# Patient Record
Sex: Female | Born: 1935 | Race: White | Hispanic: No | Smoking: Former smoker
Health system: Southern US, Community
[De-identification: ages and names within clinical notes are randomized; demographics above are authoritative.]

## PROBLEM LIST (undated history)

## (undated) DIAGNOSIS — G629 Polyneuropathy, unspecified: Secondary | ICD-10-CM

## (undated) DIAGNOSIS — C801 Malignant (primary) neoplasm, unspecified: Secondary | ICD-10-CM

## (undated) DIAGNOSIS — M25562 Pain in left knee: Secondary | ICD-10-CM

## (undated) DIAGNOSIS — I2510 Atherosclerotic heart disease of native coronary artery without angina pectoris: Secondary | ICD-10-CM

## (undated) DIAGNOSIS — K219 Gastro-esophageal reflux disease without esophagitis: Secondary | ICD-10-CM

## (undated) DIAGNOSIS — E785 Hyperlipidemia, unspecified: Secondary | ICD-10-CM

## (undated) HISTORY — PX: COLONOSCOPY W/ POLYPECTOMY: SHX1380

## (undated) HISTORY — DX: Hyperlipidemia, unspecified: E78.5

## (undated) HISTORY — DX: Gastro-esophageal reflux disease without esophagitis: K21.9

## (undated) HISTORY — PX: FOOT ARTHRODESIS: SHX1655

## (undated) HISTORY — DX: Pain in left knee: M25.562

## (undated) HISTORY — PX: ABDOMINAL HYSTERECTOMY: SHX81

## (undated) HISTORY — PX: OTHER SURGICAL HISTORY: SHX169

## (undated) HISTORY — PX: MASTECTOMY: SHX3

---

## 1999-10-08 ENCOUNTER — Emergency Department (HOSPITAL_COMMUNITY): Admission: EM | Admit: 1999-10-08 | Discharge: 1999-10-08 | Payer: Self-pay | Admitting: Emergency Medicine

## 2009-10-30 ENCOUNTER — Emergency Department (HOSPITAL_BASED_OUTPATIENT_CLINIC_OR_DEPARTMENT_OTHER): Admission: EM | Admit: 2009-10-30 | Discharge: 2009-10-30 | Payer: Self-pay | Admitting: Emergency Medicine

## 2009-10-30 ENCOUNTER — Ambulatory Visit: Payer: Self-pay | Admitting: Diagnostic Radiology

## 2012-04-12 ENCOUNTER — Emergency Department (HOSPITAL_BASED_OUTPATIENT_CLINIC_OR_DEPARTMENT_OTHER): Payer: Medicare Other

## 2012-04-12 ENCOUNTER — Emergency Department (HOSPITAL_BASED_OUTPATIENT_CLINIC_OR_DEPARTMENT_OTHER)
Admission: EM | Admit: 2012-04-12 | Discharge: 2012-04-12 | Disposition: A | Discharge status: Home / Self Care | Payer: Medicare Other | Attending: Emergency Medicine | Admitting: Emergency Medicine

## 2012-04-12 ENCOUNTER — Encounter (HOSPITAL_BASED_OUTPATIENT_CLINIC_OR_DEPARTMENT_OTHER): Payer: Self-pay | Admitting: *Deleted

## 2012-04-12 DIAGNOSIS — J069 Acute upper respiratory infection, unspecified: Secondary | ICD-10-CM | POA: Insufficient documentation

## 2012-04-12 DIAGNOSIS — R059 Cough, unspecified: Secondary | ICD-10-CM | POA: Insufficient documentation

## 2012-04-12 DIAGNOSIS — I2510 Atherosclerotic heart disease of native coronary artery without angina pectoris: Secondary | ICD-10-CM | POA: Insufficient documentation

## 2012-04-12 DIAGNOSIS — W010XXA Fall on same level from slipping, tripping and stumbling without subsequent striking against object, initial encounter: Secondary | ICD-10-CM | POA: Insufficient documentation

## 2012-04-12 DIAGNOSIS — Z853 Personal history of malignant neoplasm of breast: Secondary | ICD-10-CM | POA: Insufficient documentation

## 2012-04-12 DIAGNOSIS — M79609 Pain in unspecified limb: Secondary | ICD-10-CM | POA: Insufficient documentation

## 2012-04-12 DIAGNOSIS — R05 Cough: Secondary | ICD-10-CM | POA: Insufficient documentation

## 2012-04-12 DIAGNOSIS — S52609A Unspecified fracture of lower end of unspecified ulna, initial encounter for closed fracture: Secondary | ICD-10-CM | POA: Insufficient documentation

## 2012-04-12 DIAGNOSIS — S0990XA Unspecified injury of head, initial encounter: Secondary | ICD-10-CM | POA: Insufficient documentation

## 2012-04-12 DIAGNOSIS — W19XXXA Unspecified fall, initial encounter: Secondary | ICD-10-CM

## 2012-04-12 HISTORY — DX: Atherosclerotic heart disease of native coronary artery without angina pectoris: I25.10

## 2012-04-12 HISTORY — DX: Polyneuropathy, unspecified: G62.9

## 2012-04-12 HISTORY — DX: Malignant (primary) neoplasm, unspecified: C80.1

## 2012-04-12 LAB — CBC WITH DIFFERENTIAL/PLATELET
Basophils Relative: 0 % (ref 0–1)
Eosinophils Absolute: 0.1 10*3/uL (ref 0.0–0.7)
Eosinophils Relative: 1 % (ref 0–5)
HCT: 29.3 % — ABNORMAL LOW (ref 36.0–46.0)
Lymphocytes Relative: 6 % — ABNORMAL LOW (ref 12–46)
Lymphs Abs: 0.6 10*3/uL — ABNORMAL LOW (ref 0.7–4.0)
MCH: 27.3 pg (ref 26.0–34.0)
MCV: 76.3 fL — ABNORMAL LOW (ref 78.0–100.0)
Monocytes Absolute: 0.6 10*3/uL (ref 0.1–1.0)
Monocytes Relative: 6 % (ref 3–12)
RBC: 3.84 MIL/uL — ABNORMAL LOW (ref 3.87–5.11)
WBC: 10.6 10*3/uL — ABNORMAL HIGH (ref 4.0–10.5)

## 2012-04-12 LAB — BASIC METABOLIC PANEL
BUN: 13 mg/dL (ref 6–23)
Creatinine, Ser: 1 mg/dL (ref 0.50–1.10)
GFR calc Af Amer: 62 mL/min — ABNORMAL LOW (ref >90)
GFR calc non Af Amer: 54 mL/min — ABNORMAL LOW (ref >90)
Potassium: 4.2 mEq/L (ref 3.5–5.1)

## 2012-04-12 MED ORDER — OXYCODONE-ACETAMINOPHEN 5-325 MG PO TABS
0.5000 | ORAL_TABLET | Freq: Once | ORAL | Status: AC
  Administered 2012-04-12: 0.5 via ORAL
  Filled 2012-04-12: qty 1

## 2012-04-12 MED ORDER — OXYCODONE-ACETAMINOPHEN 5-325 MG PO TABS: 0.5000 | ORAL_TABLET | Freq: Four times a day (QID) | ORAL | Status: AC | PRN

## 2012-04-12 MED ORDER — SODIUM CHLORIDE 0.9 % IV BOLUS (SEPSIS): 500.0000 mL | Freq: Once | INTRAVENOUS | Status: DC

## 2012-04-12 NOTE — ED Notes (Signed)
Patient ambulatory with stand by assistance; patient denies dizziness; gait steady.

## 2012-04-12 NOTE — ED Notes (Signed)
Splint applied; patient also placed in sling for comfort and to support the weight of the splint.

## 2012-04-12 NOTE — Discharge Instructions (Signed)
Ulnar Fracture You have a fracture (broken bone) of the forearm. This is the part of your arm between the elbow and your wrist. Your forearm is made up of two bones. These are the radius and ulna. Your fracture is in the ulna. This is the bone in your forearm located on the little finger side of your forearm. A cast or splint is used to protect and keep your injured bone from moving. The cast or splint will be on generally for about 5 to 6 weeks, with individual variations. HOME CARE INSTRUCTIONS   Keep the injured part elevated while sitting or lying down. Keep the injury above the level of your heart (the center of the chest). This will decrease swelling and pain.   Apply ice to the injury for 15 to 20 minutes, 3 to 4 times per day while awake, for 2 days. Put the ice in a plastic bag and place a towel between the bag of ice and your cast or splint.   Move your fingers to avoid stiffness and minimize swelling.   If you have a plaster or fiberglass cast:   Do not try to scratch the skin under the cast using sharp or pointed objects.   Check the skin around the cast every day. You may put lotion on any red or sore areas.   Keep your cast dry and clean.   If you have a plaster splint:   Wear the splint as directed.   You may loosen the elastic around the splint if your fingers become numb, tingle, or turn cold or blue.   Do not put pressure on any part of your cast or splint. It may break. Rest your cast only on a pillow the first 24 hours until it is fully hardened.   Your cast or splint can be protected during bathing with a plastic bag. Do not lower the cast or splint into water.   Only take over-the-counter or prescription medicines for pain, discomfort, or fever as directed by your caregiver.  SEEK IMMEDIATE MEDICAL CARE IF:   Your cast gets damaged or breaks.   You have more severe pain or swelling than you did before the cast.   You have severe pain when stretching your  fingers.   There is a bad smell or new stains and/or purulent (pus like) drainage coming from under the cast.  Document Released: 03/12/2006 Document Revised: 09/18/2011 Document Reviewed: 08/14/2007 [facility] Patient Information 2012 [place], [place].  Upper Respiratory Infection, Adult An upper respiratory infection (URI) is also sometimes known as the common cold. The upper respiratory tract includes the nose, sinuses, throat, trachea, and bronchi. Bronchi are the airways leading to the lungs. Most people improve within 1 week, but symptoms can last up to 2 weeks. A residual cough may last even longer.  CAUSES Many different viruses can infect the tissues lining the upper respiratory tract. The tissues become irritated and inflamed and often become very moist. Mucus production is also common. A cold is contagious. You can easily spread the virus to others by oral contact. This includes kissing, sharing a glass, coughing, or sneezing. Touching your mouth or nose and then touching a surface, which is then touched by another person, can also spread the virus. SYMPTOMS  Symptoms typically develop 1 to 3 days after you come in contact with a cold virus. Symptoms vary from person to person. They may include:  Runny nose.   Sneezing.   Nasal congestion.   Sinus irritation.  Sore throat.   Loss of voice (laryngitis).   Cough.   Fatigue.   Muscle aches.   Loss of appetite.   Headache.   Low-grade fever.  DIAGNOSIS  You might diagnose your own cold based on familiar symptoms, since most people get a cold 2 to 3 times a year. Your caregiver can confirm this based on your exam. Most importantly, your caregiver can check that your symptoms are not due to another disease such as strep throat, sinusitis, pneumonia, asthma, or epiglottitis. Blood tests, throat tests, and X-rays are not necessary to diagnose a common cold, but they may sometimes be helpful in excluding other more serious  diseases. Your caregiver will decide if any further tests are required. RISKS AND COMPLICATIONS  You may be at risk for a more severe case of the common cold if you smoke cigarettes, have chronic heart disease (such as heart failure) or lung disease (such as asthma), or if you have a weakened immune system. The very young and very old are also at risk for more serious infections. Bacterial sinusitis, middle ear infections, and bacterial pneumonia can complicate the common cold. The common cold can worsen asthma and chronic obstructive pulmonary disease (COPD). Sometimes, these complications can require emergency medical care and may be life-threatening. PREVENTION  The best way to protect against getting a cold is to practice good hygiene. Avoid oral or hand contact with people with cold symptoms. Wash your hands often if contact occurs. There is no clear evidence that vitamin C, vitamin E, echinacea, or exercise reduces the chance of developing a cold. However, it is always recommended to get plenty of rest and practice good nutrition. TREATMENT  Treatment is directed at relieving symptoms. There is no cure. Antibiotics are not effective, because the infection is caused by a virus, not by bacteria. Treatment may include:  Increased fluid intake. Sports drinks offer valuable electrolytes, sugars, and fluids.   Breathing heated mist or steam (vaporizer or shower).   Eating chicken soup or other clear broths, and maintaining good nutrition.   Getting plenty of rest.   Using gargles or lozenges for comfort.   Controlling fevers with ibuprofen or acetaminophen as directed by your caregiver.   Increasing usage of your inhaler if you have asthma.  Zinc gel and zinc lozenges, taken in the first 24 hours of the common cold, can shorten the duration and lessen the severity of symptoms. Pain medicines may help with fever, muscle aches, and throat pain. A variety of non-prescription medicines are available  to treat congestion and runny nose. Your caregiver can make recommendations and may suggest nasal or lung inhalers for other symptoms.  HOME CARE INSTRUCTIONS   Only take over-the-counter or prescription medicines for pain, discomfort, or fever as directed by your caregiver.   Use a warm mist humidifier or inhale steam from a shower to increase air moisture. This may keep secretions moist and make it easier to breathe.   Drink enough water and fluids to keep your urine clear or pale yellow.   Rest as needed.   Return to work when your temperature has returned to normal or as your caregiver advises. You may need to stay home longer to avoid infecting others. You can also use a face mask and careful hand washing to prevent spread of the virus.  SEEK MEDICAL CARE IF:   After the first few days, you feel you are getting worse rather than better.   You need your caregiver's advice about  medicines to control symptoms.   You develop chills, worsening shortness of breath, or brown or red sputum. These may be signs of pneumonia.   You develop yellow or brown nasal discharge or pain in the face, especially when you bend forward. These may be signs of sinusitis.   You develop a fever, swollen neck glands, pain with swallowing, or white areas in the back of your throat. These may be signs of strep throat.  SEEK IMMEDIATE MEDICAL CARE IF:   You have a fever.   You develop severe or persistent headache, ear pain, sinus pain, or chest pain.   You develop wheezing, a prolonged cough, cough up blood, or have a change in your usual mucus (if you have chronic lung disease).   You develop sore muscles or a stiff neck.  Document Released: 03/25/2001 Document Revised: 09/18/2011 Document Reviewed: 01/31/2011 [facility] Patient Information 2012 [place], [place].

## 2012-04-12 NOTE — ED Notes (Signed)
MD at bedside. 

## 2012-04-12 NOTE — ED Provider Notes (Signed)
History     CSN: [id number]  Arrival date & time 04/12/12  4098   First MD Initiated Contact with Patient 04/12/12 [phone]      Chief Complaint  Patient presents with  . Fall    (Consider location/radiation/quality/duration/timing/severity/associated sxs/prior treatment) HPI Pt reports she has had URI symptoms for the last 2 days, nasal congestion, sore throat, hoarse voice and productive cough, no fever, CP or SOB. She has felt dizzy headed, worse when she stands. She reports last night around 11pm she got up to clean the kleenexes off the living room floor and she lost her balance, falling over a chair. She reports she struck her head on the wall, but did not lose consciousness. She also injured her R great toe and her L wrist in the fall. She waited until this morning to call her daughter to bring her to the ED. Her pain in wrist and toe is moderate, aching and worse with movement.   Past Medical History  Diagnosis Date  . Cancer   . Coronary artery disease   . Neuropathy     Past Surgical History  Procedure Date  . Mastectomy   . Abdominal hysterectomy   . Colonoscopy w/ polypectomy     History reviewed. No pertinent family history.  History  Substance Use Topics  . Smoking status: Former Games developer  . Smokeless tobacco: Not on file  . Alcohol Use: No    OB History    Grav Para Term Preterm Abortions TAB SAB Ect Mult Living                  Review of Systems All other systems reviewed and are negative except as noted in HPI.   Allergies  Lortab; Sulfa antibiotics; Reclast; and Statins  Home Medications   Current Outpatient Rx  Name Route Sig Dispense Refill  . ESOMEPRAZOLE MAGNESIUM 40 MG PO CPDR Oral Take 40 mg by mouth daily before breakfast.    . SERTRALINE HCL 50 MG PO TABS Oral Take 25 mg by mouth daily.      BP 108/57  Pulse 75  Temp 98.1 F (36.7 C) (Oral)  Resp 21  SpO2 100%  Physical Exam  Nursing note and vitals reviewed. Constitutional: She  is oriented to person, place, and time. She appears well-developed and well-nourished.  HENT:  Head: Normocephalic and atraumatic.  Right Ear: Tympanic membrane and ear canal normal.  Left Ear: Tympanic membrane and ear canal normal.  Nose: Rhinorrhea present.  Mouth/Throat: Uvula is midline, oropharynx is clear and moist and mucous membranes are normal. No oropharyngeal exudate, posterior oropharyngeal edema or posterior oropharyngeal erythema.  Eyes: EOM are normal. Pupils are equal, round, and reactive to light.  Neck: Normal range of motion. Neck supple.  Cardiovascular: Normal rate, normal heart sounds and intact distal pulses.   Pulmonary/Chest: Effort normal and breath sounds normal.  Abdominal: Bowel sounds are normal. She exhibits no distension. There is no tenderness.  Musculoskeletal: She exhibits no edema and no tenderness.       Left wrist: She exhibits decreased range of motion, tenderness and bony tenderness.       Cervical back: She exhibits no bony tenderness.       Feet:  Neurological: She is alert and oriented to person, place, and time. She has normal strength. No cranial nerve deficit or sensory deficit.  Skin: Skin is warm and dry. No rash noted.  Psychiatric: She has a normal mood and affect.  ED Course  Procedures (including critical care time)  Labs Reviewed  CBC WITH DIFFERENTIAL - Abnormal; Notable for the following:    WBC 10.6 (*)     RBC 3.84 (*)     Hemoglobin 10.5 (*)     HCT 29.3 (*)     MCV 76.3 (*)     Neutrophils Relative 88 (*)     Neutro Abs 9.3 (*)     Lymphocytes Relative 6 (*)     Lymphs Abs 0.6 (*)     All other components within normal limits  BASIC METABOLIC PANEL - Abnormal; Notable for the following:    Sodium 133 (*)     Glucose, Bld 146 (*)     GFR calc non Af Amer 54 (*)     GFR calc Af Amer 62 (*)     All other components within normal limits   Dg Chest 2 View  04/12/2012  *RADIOLOGY REPORT*  Clinical Data: Cough,  congestion, fall, history of left breast cancer and left mastectomy  CHEST - 2 VIEW  Comparison: None.  Findings: Cardiomediastinal silhouette is unremarkable.  Surgical clips are noted in the left axilla and left upper chest wall.  The patient is status post left mastectomy.  No acute infiltrate or pulmonary edema.  No diagnostic pneumothorax.  IMPRESSION: No active disease.  Postsurgical changes are noted left axilla and left upper chest wall.  Status post left mastectomy.  Original Report Authenticated By: Natasha Mead, M.D.   Dg Wrist Complete Left  04/12/2012  *RADIOLOGY REPORT*  Clinical Data: Fall  LEFT WRIST - COMPLETE 3+ VIEW  Comparison: 10/30/2009  Findings: Four views of the left wrist submitted.  There is a vague cortical irregularity distal left ulna suspicious for  subtle nondisplaced fracture.  Degenerative changes are noted at the joint anterior aspect of the navicular.  Degenerative changes first carpal metacarpal joint.  Mild degenerative narrowing of radiocarpal joint.  IMPRESSION: There is subtle cortical irregularity distal aspect of the left ulna suspicious for nondisplaced fracture.  Degenerative changes distal aspect of the navicular and first carpal metacarpal joint. Degenerative changes radiocarpal joint.  Original Report Authenticated By: Natasha Mead, M.D.   Ct Head Wo Contrast  04/12/2012  *RADIOLOGY REPORT*  Clinical Data: Fall, head injury  CT HEAD WITHOUT CONTRAST  Technique:  Contiguous axial images were obtained from the base of the skull through the vertex without contrast.  Comparison: None.  Findings: No skull fracture is noted.  Paranasal sinuses shows nodular mucosal thickening right maxillary sinus measures 2.4 cm probable mucous retention cyst.  Mastoid air cells are unremarkable.  No intracranial hemorrhage, mass effect or midline shift.  No acute infarction.  No mass lesion is noted on this unenhanced scan.  IMPRESSION: No acute intracranial abnormality.  There is nodular  mucosal thickening right maxillary sinus probable due to mucous retention cyst.  Original Report Authenticated By: Natasha Mead, M.D.   Dg Toe Great Right  04/12/2012  *RADIOLOGY REPORT*  Clinical Data: Pain post fall  RIGHT GREAT TOE  Comparison: None.  Findings: Three views of the right great toe submitted.  No acute fracture or subluxation.  Mild degenerative changes distal first metatarsal.  IMPRESSION: No acute fracture or subluxation.  Mild generative changes distal first metatarsal.  Original Report Authenticated By: Natasha Mead, M.D.     1. URI (upper respiratory infection)   2. Fall   3. Ulna distal fracture       MDM   Labs and  imaging as above. Given ulnar gutter splint, placed by EMT, good distal pulses. Advised Hand followup for recheck. Advised decongestants for viral URI symptoms.        Deanza Upperman B. Bernette Mayers, MD 04/12/12 [phone]

## 2012-04-12 NOTE — ED Notes (Signed)
Patient requested "mild pain medication" prior to the application of the splint.  RN aware.

## 2012-04-12 NOTE — ED Notes (Signed)
Pt. States that she has had a "respiratory infection" since Sat. States last night she was reaching for something.Marland Kitchenlost her balance and fell. Denies loc. States she hit her head on the wall. C/o left wrist pain (outer wrist area) c/o right big toe hurting as well. No deformity noted to foot and left wrist tender to palpation. resp even and unlabored

## 2013-04-07 ENCOUNTER — Other Ambulatory Visit: Payer: Self-pay | Admitting: *Deleted

## 2013-04-07 ENCOUNTER — Other Ambulatory Visit: Payer: Self-pay | Admitting: Family Medicine

## 2014-03-31 DIAGNOSIS — E785 Hyperlipidemia, unspecified: Secondary | ICD-10-CM | POA: Diagnosis present

## 2014-03-31 DIAGNOSIS — K219 Gastro-esophageal reflux disease without esophagitis: Secondary | ICD-10-CM | POA: Diagnosis present

## 2014-03-31 DIAGNOSIS — E669 Obesity, unspecified: Secondary | ICD-10-CM | POA: Insufficient documentation

## 2014-04-20 DIAGNOSIS — I252 Old myocardial infarction: Secondary | ICD-10-CM | POA: Insufficient documentation

## 2014-04-20 DIAGNOSIS — N183 Chronic kidney disease, stage 3 unspecified: Secondary | ICD-10-CM | POA: Insufficient documentation

## 2014-04-20 DIAGNOSIS — M5416 Radiculopathy, lumbar region: Secondary | ICD-10-CM | POA: Insufficient documentation

## 2014-04-20 DIAGNOSIS — F3342 Major depressive disorder, recurrent, in full remission: Secondary | ICD-10-CM | POA: Insufficient documentation

## 2014-04-20 DIAGNOSIS — E559 Vitamin D deficiency, unspecified: Secondary | ICD-10-CM | POA: Insufficient documentation

## 2014-10-26 DIAGNOSIS — I739 Peripheral vascular disease, unspecified: Secondary | ICD-10-CM | POA: Diagnosis not present

## 2014-10-26 DIAGNOSIS — L603 Nail dystrophy: Secondary | ICD-10-CM | POA: Diagnosis not present

## 2014-12-04 DIAGNOSIS — J209 Acute bronchitis, unspecified: Secondary | ICD-10-CM | POA: Diagnosis not present

## 2014-12-04 DIAGNOSIS — R05 Cough: Secondary | ICD-10-CM | POA: Diagnosis not present

## 2014-12-14 DIAGNOSIS — R42 Dizziness and giddiness: Secondary | ICD-10-CM | POA: Diagnosis not present

## 2014-12-14 DIAGNOSIS — E538 Deficiency of other specified B group vitamins: Secondary | ICD-10-CM | POA: Diagnosis not present

## 2014-12-14 DIAGNOSIS — F331 Major depressive disorder, recurrent, moderate: Secondary | ICD-10-CM | POA: Diagnosis not present

## 2014-12-14 DIAGNOSIS — R05 Cough: Secondary | ICD-10-CM | POA: Diagnosis not present

## 2014-12-14 DIAGNOSIS — I959 Hypotension, unspecified: Secondary | ICD-10-CM | POA: Diagnosis not present

## 2014-12-19 DIAGNOSIS — D649 Anemia, unspecified: Secondary | ICD-10-CM | POA: Diagnosis not present

## 2014-12-22 DIAGNOSIS — J189 Pneumonia, unspecified organism: Secondary | ICD-10-CM | POA: Diagnosis not present

## 2014-12-26 DIAGNOSIS — D6489 Other specified anemias: Secondary | ICD-10-CM | POA: Diagnosis not present

## 2015-01-04 DIAGNOSIS — D509 Iron deficiency anemia, unspecified: Secondary | ICD-10-CM | POA: Diagnosis not present

## 2015-01-04 DIAGNOSIS — J189 Pneumonia, unspecified organism: Secondary | ICD-10-CM | POA: Diagnosis not present

## 2015-01-04 DIAGNOSIS — I739 Peripheral vascular disease, unspecified: Secondary | ICD-10-CM | POA: Diagnosis not present

## 2015-01-04 DIAGNOSIS — Z800 Family history of malignant neoplasm of digestive organs: Secondary | ICD-10-CM | POA: Diagnosis not present

## 2015-01-08 DIAGNOSIS — M1712 Unilateral primary osteoarthritis, left knee: Secondary | ICD-10-CM | POA: Diagnosis not present

## 2015-01-11 DIAGNOSIS — I739 Peripheral vascular disease, unspecified: Secondary | ICD-10-CM | POA: Diagnosis not present

## 2015-01-11 DIAGNOSIS — L603 Nail dystrophy: Secondary | ICD-10-CM | POA: Diagnosis not present

## 2015-01-11 DIAGNOSIS — L84 Corns and callosities: Secondary | ICD-10-CM | POA: Diagnosis not present

## 2015-03-04 DIAGNOSIS — L03114 Cellulitis of left upper limb: Secondary | ICD-10-CM | POA: Diagnosis not present

## 2015-03-04 DIAGNOSIS — S61452A Open bite of left hand, initial encounter: Secondary | ICD-10-CM | POA: Diagnosis not present

## 2015-03-08 DIAGNOSIS — L03114 Cellulitis of left upper limb: Secondary | ICD-10-CM | POA: Diagnosis not present

## 2015-03-08 DIAGNOSIS — J189 Pneumonia, unspecified organism: Secondary | ICD-10-CM | POA: Diagnosis not present

## 2015-03-08 DIAGNOSIS — R2689 Other abnormalities of gait and mobility: Secondary | ICD-10-CM | POA: Diagnosis not present

## 2015-03-08 DIAGNOSIS — D649 Anemia, unspecified: Secondary | ICD-10-CM | POA: Diagnosis not present

## 2015-03-08 DIAGNOSIS — R6889 Other general symptoms and signs: Secondary | ICD-10-CM | POA: Diagnosis not present

## 2015-03-08 DIAGNOSIS — Z09 Encounter for follow-up examination after completed treatment for conditions other than malignant neoplasm: Secondary | ICD-10-CM | POA: Diagnosis not present

## 2015-03-13 DIAGNOSIS — D631 Anemia in chronic kidney disease: Secondary | ICD-10-CM | POA: Diagnosis present

## 2015-03-29 DIAGNOSIS — L603 Nail dystrophy: Secondary | ICD-10-CM | POA: Diagnosis not present

## 2015-03-29 DIAGNOSIS — I739 Peripheral vascular disease, unspecified: Secondary | ICD-10-CM | POA: Diagnosis not present

## 2015-04-05 DIAGNOSIS — N189 Chronic kidney disease, unspecified: Secondary | ICD-10-CM | POA: Diagnosis not present

## 2015-04-05 DIAGNOSIS — R2689 Other abnormalities of gait and mobility: Secondary | ICD-10-CM | POA: Diagnosis not present

## 2015-04-05 DIAGNOSIS — D631 Anemia in chronic kidney disease: Secondary | ICD-10-CM | POA: Diagnosis not present

## 2015-04-05 DIAGNOSIS — N183 Chronic kidney disease, stage 3 (moderate): Secondary | ICD-10-CM | POA: Diagnosis not present

## 2015-04-19 DIAGNOSIS — M6281 Muscle weakness (generalized): Secondary | ICD-10-CM | POA: Diagnosis not present

## 2015-04-19 DIAGNOSIS — R2689 Other abnormalities of gait and mobility: Secondary | ICD-10-CM | POA: Diagnosis not present

## 2015-04-20 DIAGNOSIS — R739 Hyperglycemia, unspecified: Secondary | ICD-10-CM | POA: Diagnosis not present

## 2015-04-26 DIAGNOSIS — M6281 Muscle weakness (generalized): Secondary | ICD-10-CM | POA: Diagnosis not present

## 2015-04-26 DIAGNOSIS — R2689 Other abnormalities of gait and mobility: Secondary | ICD-10-CM | POA: Diagnosis not present

## 2015-05-17 DIAGNOSIS — M6281 Muscle weakness (generalized): Secondary | ICD-10-CM | POA: Diagnosis not present

## 2015-05-17 DIAGNOSIS — R2689 Other abnormalities of gait and mobility: Secondary | ICD-10-CM | POA: Diagnosis not present

## 2015-05-23 DIAGNOSIS — M6281 Muscle weakness (generalized): Secondary | ICD-10-CM | POA: Diagnosis not present

## 2015-05-23 DIAGNOSIS — R2689 Other abnormalities of gait and mobility: Secondary | ICD-10-CM | POA: Diagnosis not present

## 2015-05-31 DIAGNOSIS — M6281 Muscle weakness (generalized): Secondary | ICD-10-CM | POA: Diagnosis not present

## 2015-05-31 DIAGNOSIS — R2689 Other abnormalities of gait and mobility: Secondary | ICD-10-CM | POA: Diagnosis not present

## 2015-06-07 DIAGNOSIS — M6281 Muscle weakness (generalized): Secondary | ICD-10-CM | POA: Diagnosis not present

## 2015-06-07 DIAGNOSIS — R2689 Other abnormalities of gait and mobility: Secondary | ICD-10-CM | POA: Diagnosis not present

## 2015-06-14 DIAGNOSIS — I739 Peripheral vascular disease, unspecified: Secondary | ICD-10-CM | POA: Diagnosis not present

## 2015-06-14 DIAGNOSIS — L603 Nail dystrophy: Secondary | ICD-10-CM | POA: Diagnosis not present

## 2015-06-21 DIAGNOSIS — M6281 Muscle weakness (generalized): Secondary | ICD-10-CM | POA: Diagnosis not present

## 2015-06-21 DIAGNOSIS — R2689 Other abnormalities of gait and mobility: Secondary | ICD-10-CM | POA: Diagnosis not present

## 2015-06-28 DIAGNOSIS — M6281 Muscle weakness (generalized): Secondary | ICD-10-CM | POA: Diagnosis not present

## 2015-06-28 DIAGNOSIS — R2689 Other abnormalities of gait and mobility: Secondary | ICD-10-CM | POA: Diagnosis not present

## 2015-07-05 DIAGNOSIS — M6281 Muscle weakness (generalized): Secondary | ICD-10-CM | POA: Diagnosis not present

## 2015-07-10 DIAGNOSIS — Z23 Encounter for immunization: Secondary | ICD-10-CM | POA: Diagnosis not present

## 2015-07-10 DIAGNOSIS — R1013 Epigastric pain: Secondary | ICD-10-CM | POA: Diagnosis not present

## 2015-07-10 DIAGNOSIS — R142 Eructation: Secondary | ICD-10-CM | POA: Diagnosis not present

## 2015-07-12 DIAGNOSIS — R2689 Other abnormalities of gait and mobility: Secondary | ICD-10-CM | POA: Diagnosis not present

## 2015-07-12 DIAGNOSIS — M6281 Muscle weakness (generalized): Secondary | ICD-10-CM | POA: Diagnosis not present

## 2015-07-13 DIAGNOSIS — K760 Fatty (change of) liver, not elsewhere classified: Secondary | ICD-10-CM | POA: Diagnosis not present

## 2015-07-13 DIAGNOSIS — N281 Cyst of kidney, acquired: Secondary | ICD-10-CM | POA: Diagnosis not present

## 2015-07-13 DIAGNOSIS — K828 Other specified diseases of gallbladder: Secondary | ICD-10-CM | POA: Diagnosis not present

## 2015-07-13 DIAGNOSIS — K8020 Calculus of gallbladder without cholecystitis without obstruction: Secondary | ICD-10-CM | POA: Diagnosis not present

## 2015-07-13 DIAGNOSIS — R142 Eructation: Secondary | ICD-10-CM | POA: Diagnosis not present

## 2015-07-13 DIAGNOSIS — R1013 Epigastric pain: Secondary | ICD-10-CM | POA: Diagnosis not present

## 2015-07-19 DIAGNOSIS — M6281 Muscle weakness (generalized): Secondary | ICD-10-CM | POA: Diagnosis not present

## 2015-07-19 DIAGNOSIS — R2689 Other abnormalities of gait and mobility: Secondary | ICD-10-CM | POA: Diagnosis not present

## 2015-07-26 DIAGNOSIS — M6281 Muscle weakness (generalized): Secondary | ICD-10-CM | POA: Diagnosis not present

## 2015-07-26 DIAGNOSIS — R2689 Other abnormalities of gait and mobility: Secondary | ICD-10-CM | POA: Diagnosis not present

## 2015-07-31 DIAGNOSIS — R2689 Other abnormalities of gait and mobility: Secondary | ICD-10-CM | POA: Diagnosis not present

## 2015-07-31 DIAGNOSIS — M6281 Muscle weakness (generalized): Secondary | ICD-10-CM | POA: Diagnosis not present

## 2015-08-06 DIAGNOSIS — E785 Hyperlipidemia, unspecified: Secondary | ICD-10-CM | POA: Diagnosis not present

## 2015-08-06 DIAGNOSIS — N281 Cyst of kidney, acquired: Secondary | ICD-10-CM | POA: Diagnosis not present

## 2015-08-06 DIAGNOSIS — E559 Vitamin D deficiency, unspecified: Secondary | ICD-10-CM | POA: Diagnosis not present

## 2015-08-06 DIAGNOSIS — E538 Deficiency of other specified B group vitamins: Secondary | ICD-10-CM | POA: Diagnosis not present

## 2015-08-06 DIAGNOSIS — K8020 Calculus of gallbladder without cholecystitis without obstruction: Secondary | ICD-10-CM | POA: Diagnosis not present

## 2015-08-06 DIAGNOSIS — R946 Abnormal results of thyroid function studies: Secondary | ICD-10-CM | POA: Diagnosis not present

## 2015-08-06 DIAGNOSIS — N183 Chronic kidney disease, stage 3 (moderate): Secondary | ICD-10-CM | POA: Diagnosis not present

## 2015-08-06 DIAGNOSIS — F339 Major depressive disorder, recurrent, unspecified: Secondary | ICD-10-CM | POA: Diagnosis not present

## 2015-08-06 DIAGNOSIS — Z1239 Encounter for other screening for malignant neoplasm of breast: Secondary | ICD-10-CM | POA: Diagnosis not present

## 2015-08-09 DIAGNOSIS — R2689 Other abnormalities of gait and mobility: Secondary | ICD-10-CM | POA: Diagnosis not present

## 2015-08-09 DIAGNOSIS — M6281 Muscle weakness (generalized): Secondary | ICD-10-CM | POA: Diagnosis not present

## 2015-08-23 DIAGNOSIS — R2689 Other abnormalities of gait and mobility: Secondary | ICD-10-CM | POA: Diagnosis not present

## 2015-08-23 DIAGNOSIS — M6281 Muscle weakness (generalized): Secondary | ICD-10-CM | POA: Diagnosis not present

## 2015-08-30 DIAGNOSIS — I739 Peripheral vascular disease, unspecified: Secondary | ICD-10-CM | POA: Diagnosis not present

## 2015-08-30 DIAGNOSIS — L603 Nail dystrophy: Secondary | ICD-10-CM | POA: Diagnosis not present

## 2015-09-18 DIAGNOSIS — Z1231 Encounter for screening mammogram for malignant neoplasm of breast: Secondary | ICD-10-CM | POA: Diagnosis not present

## 2015-09-18 DIAGNOSIS — Z1239 Encounter for other screening for malignant neoplasm of breast: Secondary | ICD-10-CM | POA: Diagnosis not present

## 2015-09-27 DIAGNOSIS — R2689 Other abnormalities of gait and mobility: Secondary | ICD-10-CM | POA: Diagnosis not present

## 2015-09-27 DIAGNOSIS — M6281 Muscle weakness (generalized): Secondary | ICD-10-CM | POA: Diagnosis not present

## 2015-10-03 DIAGNOSIS — Z961 Presence of intraocular lens: Secondary | ICD-10-CM | POA: Diagnosis not present

## 2015-10-03 DIAGNOSIS — H35363 Drusen (degenerative) of macula, bilateral: Secondary | ICD-10-CM | POA: Diagnosis not present

## 2015-10-03 DIAGNOSIS — H2511 Age-related nuclear cataract, right eye: Secondary | ICD-10-CM | POA: Diagnosis not present

## 2015-10-03 DIAGNOSIS — H524 Presbyopia: Secondary | ICD-10-CM | POA: Diagnosis not present

## 2015-10-03 DIAGNOSIS — H25011 Cortical age-related cataract, right eye: Secondary | ICD-10-CM | POA: Diagnosis not present

## 2015-10-19 DIAGNOSIS — H01001 Unspecified blepharitis right upper eyelid: Secondary | ICD-10-CM | POA: Diagnosis not present

## 2015-11-08 DIAGNOSIS — L84 Corns and callosities: Secondary | ICD-10-CM | POA: Diagnosis not present

## 2015-11-08 DIAGNOSIS — I739 Peripheral vascular disease, unspecified: Secondary | ICD-10-CM | POA: Diagnosis not present

## 2015-11-08 DIAGNOSIS — L603 Nail dystrophy: Secondary | ICD-10-CM | POA: Diagnosis not present

## 2015-12-18 DIAGNOSIS — L989 Disorder of the skin and subcutaneous tissue, unspecified: Secondary | ICD-10-CM | POA: Diagnosis not present

## 2015-12-18 DIAGNOSIS — E559 Vitamin D deficiency, unspecified: Secondary | ICD-10-CM | POA: Diagnosis not present

## 2015-12-18 DIAGNOSIS — E539 Vitamin B deficiency, unspecified: Secondary | ICD-10-CM | POA: Diagnosis not present

## 2015-12-18 DIAGNOSIS — E785 Hyperlipidemia, unspecified: Secondary | ICD-10-CM | POA: Diagnosis not present

## 2015-12-18 DIAGNOSIS — E538 Deficiency of other specified B group vitamins: Secondary | ICD-10-CM | POA: Diagnosis not present

## 2015-12-18 DIAGNOSIS — N183 Chronic kidney disease, stage 3 (moderate): Secondary | ICD-10-CM | POA: Diagnosis not present

## 2015-12-18 DIAGNOSIS — D631 Anemia in chronic kidney disease: Secondary | ICD-10-CM | POA: Diagnosis not present

## 2015-12-18 DIAGNOSIS — N189 Chronic kidney disease, unspecified: Secondary | ICD-10-CM | POA: Diagnosis not present

## 2015-12-18 DIAGNOSIS — R946 Abnormal results of thyroid function studies: Secondary | ICD-10-CM | POA: Diagnosis not present

## 2016-01-01 DIAGNOSIS — Z85828 Personal history of other malignant neoplasm of skin: Secondary | ICD-10-CM | POA: Diagnosis not present

## 2016-01-01 DIAGNOSIS — C44629 Squamous cell carcinoma of skin of left upper limb, including shoulder: Secondary | ICD-10-CM | POA: Diagnosis not present

## 2016-01-01 DIAGNOSIS — Z08 Encounter for follow-up examination after completed treatment for malignant neoplasm: Secondary | ICD-10-CM | POA: Diagnosis not present

## 2016-01-01 DIAGNOSIS — L821 Other seborrheic keratosis: Secondary | ICD-10-CM | POA: Diagnosis not present

## 2016-01-14 DIAGNOSIS — J209 Acute bronchitis, unspecified: Secondary | ICD-10-CM | POA: Diagnosis not present

## 2016-02-07 DIAGNOSIS — L84 Corns and callosities: Secondary | ICD-10-CM | POA: Diagnosis not present

## 2016-02-07 DIAGNOSIS — L603 Nail dystrophy: Secondary | ICD-10-CM | POA: Diagnosis not present

## 2016-02-07 DIAGNOSIS — I739 Peripheral vascular disease, unspecified: Secondary | ICD-10-CM | POA: Diagnosis not present

## 2016-04-03 DIAGNOSIS — K219 Gastro-esophageal reflux disease without esophagitis: Secondary | ICD-10-CM | POA: Diagnosis not present

## 2016-04-03 DIAGNOSIS — R232 Flushing: Secondary | ICD-10-CM | POA: Diagnosis not present

## 2016-04-07 ENCOUNTER — Emergency Department (HOSPITAL_COMMUNITY)
Admission: EM | Admit: 2016-04-07 | Discharge: 2016-04-08 | Disposition: A | Discharge status: Home / Self Care | Payer: Medicare Other | Attending: Emergency Medicine | Admitting: Emergency Medicine

## 2016-04-07 ENCOUNTER — Encounter (HOSPITAL_COMMUNITY): Payer: Self-pay | Admitting: Emergency Medicine

## 2016-04-07 ENCOUNTER — Emergency Department (HOSPITAL_COMMUNITY): Payer: Medicare Other

## 2016-04-07 DIAGNOSIS — Y9301 Activity, walking, marching and hiking: Secondary | ICD-10-CM | POA: Insufficient documentation

## 2016-04-07 DIAGNOSIS — Y92009 Unspecified place in unspecified non-institutional (private) residence as the place of occurrence of the external cause: Secondary | ICD-10-CM | POA: Insufficient documentation

## 2016-04-07 DIAGNOSIS — Y999 Unspecified external cause status: Secondary | ICD-10-CM | POA: Insufficient documentation

## 2016-04-07 DIAGNOSIS — T148 Other injury of unspecified body region: Secondary | ICD-10-CM | POA: Diagnosis not present

## 2016-04-07 DIAGNOSIS — S8002XA Contusion of left knee, initial encounter: Secondary | ICD-10-CM | POA: Insufficient documentation

## 2016-04-07 DIAGNOSIS — Z87891 Personal history of nicotine dependence: Secondary | ICD-10-CM | POA: Insufficient documentation

## 2016-04-07 DIAGNOSIS — W010XXA Fall on same level from slipping, tripping and stumbling without subsequent striking against object, initial encounter: Secondary | ICD-10-CM | POA: Insufficient documentation

## 2016-04-07 DIAGNOSIS — I2510 Atherosclerotic heart disease of native coronary artery without angina pectoris: Secondary | ICD-10-CM | POA: Diagnosis not present

## 2016-04-07 DIAGNOSIS — Z79899 Other long term (current) drug therapy: Secondary | ICD-10-CM | POA: Insufficient documentation

## 2016-04-07 DIAGNOSIS — Z859 Personal history of malignant neoplasm, unspecified: Secondary | ICD-10-CM | POA: Diagnosis not present

## 2016-04-07 DIAGNOSIS — S8992XA Unspecified injury of left lower leg, initial encounter: Secondary | ICD-10-CM | POA: Diagnosis not present

## 2016-04-07 DIAGNOSIS — W19XXXA Unspecified fall, initial encounter: Secondary | ICD-10-CM

## 2016-04-07 DIAGNOSIS — M25562 Pain in left knee: Secondary | ICD-10-CM | POA: Diagnosis not present

## 2016-04-07 MED ORDER — ACETAMINOPHEN 500 MG PO TABS
1000.0000 mg | ORAL_TABLET | Freq: Once | ORAL | Status: AC
  Administered 2016-04-07: 1000 mg via ORAL
  Filled 2016-04-07: qty 2

## 2016-04-07 NOTE — ED Notes (Signed)
Bed: WA07 Expected date:  Expected time:  Means of arrival:  Comments: Fall and Knee Pain EMS (Old Person)

## 2016-04-07 NOTE — ED Provider Notes (Signed)
CSN: [id number]     Arrival date & time 04/07/16  2150 History  By signing my name below, I, Jasmyn B. Alexander, attest that this documentation has been prepared under the direction and in the presence of Antonietta Breach, PA-C  Electronically Signed: Tedra Coupe. Sheppard Coil, ED Scribe. 04/07/2016. 10:39 PM.   Chief Complaint  Patient presents with  . Fall    The history is provided by the patient. No language interpreter was used.   HPI Comments: Shauniece Jacobs is a 82 y.o. female brought in by ambulance, with PMHx of CAD and Neuropathy who presents to the Emergency Department complaining of gradual onset, constant, worsening, left knee pain s/p fall that occurred 9 hrs PTA. Pt reports she was walking back into her house, tripped over her shoes and fell onto her knees around 1300. No head injury or LOC. She applied ice to her left knee with mild relief. She reports taking a nap shortly after her fall and when she woke up around 1800, her pain was significantly increased. Denies any SOB or lightheadedness prior to falling. Pt has not had any issues with left knee before. She notes pain is exacerbated upon applying pressure or standing up. Pt does not regularly walk with cane or walker at home.   Past Medical History  Diagnosis Date  . Cancer ([place])   . Coronary artery disease   . Neuropathy [facility])    Past Surgical History  Procedure Laterality Date  . Mastectomy    . Abdominal hysterectomy    . Colonoscopy w/ polypectomy     No family history on file. Social History  Substance Use Topics  . Smoking status: Former Research scientist (life sciences)  . Smokeless tobacco: None  . Alcohol Use: No   OB History    No data available      Review of Systems  Constitutional: Negative for fever.  Musculoskeletal: Positive for myalgias, joint swelling and arthralgias.  A complete 10 system review of systems was obtained and all systems are negative except as noted in the HPI and PMH.    Allergies  Lortab; Sulfa  antibiotics; Reclast; and Statins  Home Medications   Prior to Admission medications   Medication Sig Start Date End Date Taking? Authorizing Provider  diclofenac sodium (VOLTAREN) 1 % GEL Apply 4 g topically 4 (four) times daily. Rub in to your right knee joint as prescribed 04/08/16   Antonietta Breach, PA-C  esomeprazole (NEXIUM) 40 MG capsule Take 40 mg by mouth daily before breakfast.    Historical Provider, MD  oxyCODONE-acetaminophen (PERCOCET/ROXICET) 5-325 MG tablet Take 1-2 tablets by mouth every 6 (six) hours as needed for severe pain. 04/08/16   Antonietta Breach, PA-C  predniSONE (DELTASONE) 20 MG tablet Take 2 tablets (40 mg total) by mouth daily. Take 40 mg by mouth daily for 3 days, then 20mg  by mouth daily for 3 days, then 10mg  daily for 3 days 04/08/16   Antonietta Breach, PA-C  sertraline (ZOLOFT) 50 MG tablet Take 25 mg by mouth daily.    Historical Provider, MD   BP 128/63 mmHg  Pulse 60  Temp(Src) 98.7 F (37.1 C) (Oral)  Resp 18  SpO2 99%   Physical Exam  Constitutional: She is oriented to person, place, and time. She appears well-developed and well-nourished. No distress.  Nontoxic appearing  HENT:  Head: Normocephalic and atraumatic.  Eyes: Conjunctivae and EOM are normal. No scleral icterus.  Neck: Normal range of motion.  Cardiovascular: Normal rate, regular rhythm and intact distal  pulses.   DP and PT pulses 2+ in the LLE  Pulmonary/Chest: Effort normal. No respiratory distress.  Respirations even and unlabored  Musculoskeletal:       Left knee: She exhibits decreased range of motion (decreased AROM secondary to pain), swelling and effusion (mild). She exhibits no deformity, no erythema and normal alignment. Tenderness found. Medial joint line tenderness noted.  Neurological: She is alert and oriented to person, place, and time. She exhibits normal muscle tone. Coordination normal.  Sensation to light touch intact in BLE.   Skin: Skin is warm and dry. No rash noted. She is  not diaphoretic. No erythema. No pallor.  Psychiatric: She has a normal mood and affect. Her behavior is normal.  Nursing note and vitals reviewed.   ED Course  Procedures (including critical care time) DIAGNOSTIC STUDIES: Oxygen Saturation is 100% on RA, normal by my interpretation.    COORDINATION OF CARE: 10:35 PM-Discussed treatment plan which includes X-ray of left knee with pt at bedside and pt agreed to plan.   Imaging Review Dg Knee Complete 4 Views Left  04/07/2016  CLINICAL DATA:  82 year old female with fall and left knee pain EXAM: LEFT KNEE - COMPLETE 4+ VIEW COMPARISON:  None FINDINGS: There is no acute fracture or dislocation. The bones are osteopenia. There is a small suprapatellar effusion. The soft tissues are grossly unremarkable with noted a foreign object. IMPRESSION: No fracture or dislocation. Electronically Signed   By: Anner Crete M.D.   On: 04/07/2016 23:28   I have personally reviewed and evaluated these images and lab results as part of my medical decision-making.   MDM   Final diagnoses:  Knee contusion, left, initial encounter  Fall, initial encounter    82 year old female presents to the emergency department for evaluation of left knee pain after a fall prior to arrival. She denies head trauma and loss of consciousness. Patient neurovascularly intact on exam. No concern for septic joint. She reports weightbearing for many hours after her initial injury. She had worsening pain with weightbearing after waking from a nap tonight. X-ray negative for fracture, dislocation, or bony deformity. There is a small suprapatellar effusion noted. Effusion and recent contusion likely the cause of patient's pain tonight. Patient given prescription for a rolling walker as well as instructions for supportive care. She has been followed by [facility] orthopedics in the past. She is stable for outpatient follow-up with her orthopedist. No indication for further emergent  workup at this time. Patient discharged in satisfactory condition. Patient and family with no unaddressed concerns.  I personally performed the services described in this documentation, which was scribed in my presence. The recorded information has been reviewed and is accurate.    Filed Vitals:   04/07/16 2157 04/07/16 2158 04/08/16 0014  BP: 145/65  128/63  Pulse: 65  60  Temp: 98.7 F (37.1 C)    TempSrc: Oral    Resp: 18  18  SpO2: 100% 96% 99%      Antonietta Breach, PA-C 04/08/16 0053  Virgel Manifold, MD 04/08/16 1037

## 2016-04-07 NOTE — ED Notes (Signed)
Patient transported to X-ray 

## 2016-04-07 NOTE — ED Notes (Signed)
Per EMS, around 1300 pt tripped over steps in her home and landed on her left knee. She was able to get on the couch after the fall. She fell asleep on the couch and woke up with left knee pain and unable to stand and put pressure on

## 2016-04-08 DIAGNOSIS — S8002XA Contusion of left knee, initial encounter: Secondary | ICD-10-CM | POA: Diagnosis not present

## 2016-04-08 DIAGNOSIS — M25562 Pain in left knee: Secondary | ICD-10-CM | POA: Diagnosis not present

## 2016-04-08 MED ORDER — ONDANSETRON 8 MG PO TBDP
8.0000 mg | ORAL_TABLET | Freq: Once | ORAL | Status: AC
  Administered 2016-04-08: 8 mg via ORAL
  Filled 2016-04-08: qty 1

## 2016-04-08 MED ORDER — OXYCODONE-ACETAMINOPHEN 5-325 MG PO TABS
1.0000 | ORAL_TABLET | Freq: Four times a day (QID) | ORAL | Status: DC | PRN
Start: 2016-04-08 — End: 2017-10-19

## 2016-04-08 MED ORDER — HYDROMORPHONE HCL 1 MG/ML IJ SOLN
0.5000 mg | Freq: Once | INTRAMUSCULAR | Status: AC
  Administered 2016-04-08: 0.5 mg via INTRAMUSCULAR
  Filled 2016-04-08: qty 1

## 2016-04-08 MED ORDER — KETOROLAC TROMETHAMINE 30 MG/ML IJ SOLN
15.0000 mg | Freq: Once | INTRAMUSCULAR | Status: AC
  Administered 2016-04-08: 15 mg via INTRAMUSCULAR
  Filled 2016-04-08: qty 1

## 2016-04-08 MED ORDER — DICLOFENAC SODIUM 1 % TD GEL: 4.0000 g | Freq: Four times a day (QID) | TRANSDERMAL | Status: DC

## 2016-04-08 MED ORDER — PREDNISONE 20 MG PO TABS: 40.0000 mg | ORAL_TABLET | Freq: Every day | ORAL | Status: DC

## 2016-04-08 NOTE — Discharge Instructions (Signed)
Keep your knee wrapped. Use a rolling walker when walking/transitioning. Take prednisone as prescribed and apply Voltaren gel topically as prescribed. You may take Percocet as needed for severe pain. Follow up with your orthopedist.  RICE for Routine Care of Injuries Theroutine careofmanyinjuriesincludes rest, ice, compression, and elevation (RICE therapy). RICE therapy is often recommended for injuries to soft tissues, such as a muscle strain, ligament injuries, bruises, and overuse injuries. It can also be used for some bony injuries. Using RICE therapy can help to relieve pain, lessen swelling, and enable your body to heal. Rest Rest is required to allow your body to heal. This usually involves reducing your normal activities and avoiding use of the injured part of your body. Generally, you can return to your normal activities when you are comfortable and have been given permission by your health care provider. Ice Icing your injury helps to keep the swelling down, and it lessens pain. Do not apply ice directly to your skin.  Put ice in a plastic bag.  Place a towel between your skin and the bag.  Leave the ice on for 20 minutes, 2-3 times a day. Do this for as long as you are directed by your health care provider. Compression Compression means putting pressure on the injured area. Compression helps to keep swelling down, gives support, and helps with discomfort. Compression may be done with an elastic bandage. If an elastic bandage has been applied, follow these general tips:  Remove and reapply the bandage every 3-4 hours or as directed by your health care provider.  Make sure the bandage is not wrapped too tightly, because this can cut off circulation. If part of your body beyond the bandage becomes blue, numb, cold, swollen, or more painful, your bandage is most likely too tight. If this occurs, remove your bandage and reapply it more loosely.  See your health care provider if the  bandage seems to be making your problems worse rather than better. Elevation Elevation means keeping the injured area raised. This helps to lessen swelling and decrease pain. If possible, your injured area should be elevated at or above the level of your heart or the center of your chest. [place]? You should seek medical care if:  Your pain and swelling continue.  Your symptoms are getting worse rather than improving. These symptoms may indicate that further evaluation or further X-rays are needed. Sometimes, X-rays may not show a small broken bone (fracture) until a number of days later. Make a follow-up appointment with your health care provider. WHEN SHOULD I SEEK IMMEDIATE MEDICAL CARE? You should seek immediate medical care if:  You have sudden severe pain at or below the area of your injury.  You have redness or increased swelling around your injury.  You have tingling or numbness at or below the area of your injury that does not improve after you remove the elastic bandage.   This information is not intended to replace advice given to you by your health care provider. Make sure you discuss any questions you have with your health care provider.   Document Released: 01/11/2001 Document Revised: 06/20/2015 Document Reviewed: 09/06/2014 Elsevier Interactive Patient Education [employer].

## 2016-04-14 DIAGNOSIS — M25562 Pain in left knee: Secondary | ICD-10-CM | POA: Diagnosis not present

## 2016-05-01 DIAGNOSIS — I739 Peripheral vascular disease, unspecified: Secondary | ICD-10-CM | POA: Diagnosis not present

## 2016-05-01 DIAGNOSIS — L603 Nail dystrophy: Secondary | ICD-10-CM | POA: Diagnosis not present

## 2016-07-03 DIAGNOSIS — L821 Other seborrheic keratosis: Secondary | ICD-10-CM | POA: Diagnosis not present

## 2016-07-03 DIAGNOSIS — Z85828 Personal history of other malignant neoplasm of skin: Secondary | ICD-10-CM | POA: Diagnosis not present

## 2016-07-03 DIAGNOSIS — D225 Melanocytic nevi of trunk: Secondary | ICD-10-CM | POA: Diagnosis not present

## 2016-07-03 DIAGNOSIS — Z08 Encounter for follow-up examination after completed treatment for malignant neoplasm: Secondary | ICD-10-CM | POA: Diagnosis not present

## 2016-07-08 DIAGNOSIS — R7309 Other abnormal glucose: Secondary | ICD-10-CM | POA: Diagnosis not present

## 2016-07-08 DIAGNOSIS — E538 Deficiency of other specified B group vitamins: Secondary | ICD-10-CM | POA: Diagnosis not present

## 2016-07-08 DIAGNOSIS — K219 Gastro-esophageal reflux disease without esophagitis: Secondary | ICD-10-CM | POA: Diagnosis not present

## 2016-07-08 DIAGNOSIS — Z23 Encounter for immunization: Secondary | ICD-10-CM | POA: Diagnosis not present

## 2016-07-08 DIAGNOSIS — E785 Hyperlipidemia, unspecified: Secondary | ICD-10-CM | POA: Diagnosis not present

## 2016-07-08 DIAGNOSIS — N189 Chronic kidney disease, unspecified: Secondary | ICD-10-CM | POA: Diagnosis not present

## 2016-07-08 DIAGNOSIS — N183 Chronic kidney disease, stage 3 (moderate): Secondary | ICD-10-CM | POA: Diagnosis not present

## 2016-07-08 DIAGNOSIS — F339 Major depressive disorder, recurrent, unspecified: Secondary | ICD-10-CM | POA: Diagnosis not present

## 2016-07-08 DIAGNOSIS — D631 Anemia in chronic kidney disease: Secondary | ICD-10-CM | POA: Diagnosis not present

## 2016-07-08 DIAGNOSIS — E559 Vitamin D deficiency, unspecified: Secondary | ICD-10-CM | POA: Diagnosis not present

## 2016-07-24 DIAGNOSIS — I739 Peripheral vascular disease, unspecified: Secondary | ICD-10-CM | POA: Diagnosis not present

## 2016-08-11 DIAGNOSIS — M25561 Pain in right knee: Secondary | ICD-10-CM | POA: Diagnosis not present

## 2016-08-25 DIAGNOSIS — M25561 Pain in right knee: Secondary | ICD-10-CM | POA: Diagnosis not present

## 2016-08-25 DIAGNOSIS — S82811D Torus fracture of upper end of right fibula, subsequent encounter for fracture with routine healing: Secondary | ICD-10-CM | POA: Diagnosis not present

## 2016-09-01 DIAGNOSIS — R399 Unspecified symptoms and signs involving the genitourinary system: Secondary | ICD-10-CM | POA: Diagnosis not present

## 2016-09-12 DIAGNOSIS — S82811D Torus fracture of upper end of right fibula, subsequent encounter for fracture with routine healing: Secondary | ICD-10-CM | POA: Diagnosis not present

## 2016-09-12 DIAGNOSIS — M25561 Pain in right knee: Secondary | ICD-10-CM | POA: Diagnosis not present

## 2016-10-14 DIAGNOSIS — M1711 Unilateral primary osteoarthritis, right knee: Secondary | ICD-10-CM | POA: Diagnosis not present

## 2016-10-15 DIAGNOSIS — H25011 Cortical age-related cataract, right eye: Secondary | ICD-10-CM | POA: Diagnosis not present

## 2016-10-15 DIAGNOSIS — H35363 Drusen (degenerative) of macula, bilateral: Secondary | ICD-10-CM | POA: Diagnosis not present

## 2016-10-15 DIAGNOSIS — Z961 Presence of intraocular lens: Secondary | ICD-10-CM | POA: Diagnosis not present

## 2016-10-15 DIAGNOSIS — H524 Presbyopia: Secondary | ICD-10-CM | POA: Diagnosis not present

## 2016-10-15 DIAGNOSIS — H2511 Age-related nuclear cataract, right eye: Secondary | ICD-10-CM | POA: Diagnosis not present

## 2016-10-21 DIAGNOSIS — M1711 Unilateral primary osteoarthritis, right knee: Secondary | ICD-10-CM | POA: Diagnosis not present

## 2016-10-27 DIAGNOSIS — L603 Nail dystrophy: Secondary | ICD-10-CM | POA: Diagnosis not present

## 2016-10-27 DIAGNOSIS — I739 Peripheral vascular disease, unspecified: Secondary | ICD-10-CM | POA: Diagnosis not present

## 2016-10-28 DIAGNOSIS — M1711 Unilateral primary osteoarthritis, right knee: Secondary | ICD-10-CM | POA: Diagnosis not present

## 2016-11-17 DIAGNOSIS — J209 Acute bronchitis, unspecified: Secondary | ICD-10-CM | POA: Diagnosis not present

## 2016-12-09 DIAGNOSIS — M1711 Unilateral primary osteoarthritis, right knee: Secondary | ICD-10-CM | POA: Diagnosis not present

## 2016-12-16 DIAGNOSIS — H2511 Age-related nuclear cataract, right eye: Secondary | ICD-10-CM | POA: Diagnosis not present

## 2016-12-16 DIAGNOSIS — H269 Unspecified cataract: Secondary | ICD-10-CM | POA: Diagnosis not present

## 2016-12-16 DIAGNOSIS — H25011 Cortical age-related cataract, right eye: Secondary | ICD-10-CM | POA: Diagnosis not present

## 2016-12-26 DIAGNOSIS — M25561 Pain in right knee: Secondary | ICD-10-CM | POA: Diagnosis not present

## 2016-12-26 DIAGNOSIS — M1711 Unilateral primary osteoarthritis, right knee: Secondary | ICD-10-CM | POA: Diagnosis not present

## 2017-01-08 DIAGNOSIS — R7309 Other abnormal glucose: Secondary | ICD-10-CM | POA: Diagnosis not present

## 2017-01-08 DIAGNOSIS — F330 Major depressive disorder, recurrent, mild: Secondary | ICD-10-CM | POA: Diagnosis not present

## 2017-01-08 DIAGNOSIS — N183 Chronic kidney disease, stage 3 (moderate): Secondary | ICD-10-CM | POA: Diagnosis not present

## 2017-01-08 DIAGNOSIS — E785 Hyperlipidemia, unspecified: Secondary | ICD-10-CM | POA: Diagnosis not present

## 2017-01-08 DIAGNOSIS — D631 Anemia in chronic kidney disease: Secondary | ICD-10-CM | POA: Diagnosis not present

## 2017-01-08 DIAGNOSIS — E538 Deficiency of other specified B group vitamins: Secondary | ICD-10-CM | POA: Diagnosis not present

## 2017-01-08 DIAGNOSIS — E559 Vitamin D deficiency, unspecified: Secondary | ICD-10-CM | POA: Diagnosis not present

## 2017-01-08 DIAGNOSIS — K219 Gastro-esophageal reflux disease without esophagitis: Secondary | ICD-10-CM | POA: Diagnosis not present

## 2017-01-12 DIAGNOSIS — M25561 Pain in right knee: Secondary | ICD-10-CM | POA: Diagnosis not present

## 2017-01-12 DIAGNOSIS — M1711 Unilateral primary osteoarthritis, right knee: Secondary | ICD-10-CM | POA: Diagnosis not present

## 2017-01-19 DIAGNOSIS — I739 Peripheral vascular disease, unspecified: Secondary | ICD-10-CM | POA: Diagnosis not present

## 2017-01-19 DIAGNOSIS — L603 Nail dystrophy: Secondary | ICD-10-CM | POA: Diagnosis not present

## 2017-01-29 DIAGNOSIS — R922 Inconclusive mammogram: Secondary | ICD-10-CM | POA: Diagnosis not present

## 2017-01-29 DIAGNOSIS — Z1231 Encounter for screening mammogram for malignant neoplasm of breast: Secondary | ICD-10-CM | POA: Diagnosis not present

## 2017-02-05 DIAGNOSIS — L570 Actinic keratosis: Secondary | ICD-10-CM | POA: Diagnosis not present

## 2017-02-05 DIAGNOSIS — L300 Nummular dermatitis: Secondary | ICD-10-CM | POA: Diagnosis not present

## 2017-02-09 DIAGNOSIS — R928 Other abnormal and inconclusive findings on diagnostic imaging of breast: Secondary | ICD-10-CM | POA: Diagnosis not present

## 2017-03-16 DIAGNOSIS — M1711 Unilateral primary osteoarthritis, right knee: Secondary | ICD-10-CM | POA: Diagnosis not present

## 2017-03-29 DIAGNOSIS — K120 Recurrent oral aphthae: Secondary | ICD-10-CM | POA: Diagnosis not present

## 2017-04-06 DIAGNOSIS — M722 Plantar fascial fibromatosis: Secondary | ICD-10-CM | POA: Diagnosis not present

## 2017-04-14 DIAGNOSIS — M76821 Posterior tibial tendinitis, right leg: Secondary | ICD-10-CM | POA: Diagnosis not present

## 2017-04-14 DIAGNOSIS — M76822 Posterior tibial tendinitis, left leg: Secondary | ICD-10-CM | POA: Diagnosis not present

## 2017-04-14 DIAGNOSIS — M1711 Unilateral primary osteoarthritis, right knee: Secondary | ICD-10-CM | POA: Diagnosis not present

## 2017-05-25 IMAGING — CR DG KNEE COMPLETE 4+V*L*
4 series · 4 of 4 positions shown · non-contrast
Comparison: None

CLINICAL DATA: 81-year-old female with fall and left knee pain

EXAM:
LEFT KNEE - COMPLETE 4+ VIEW

[x knee ap left (1 of 3)]
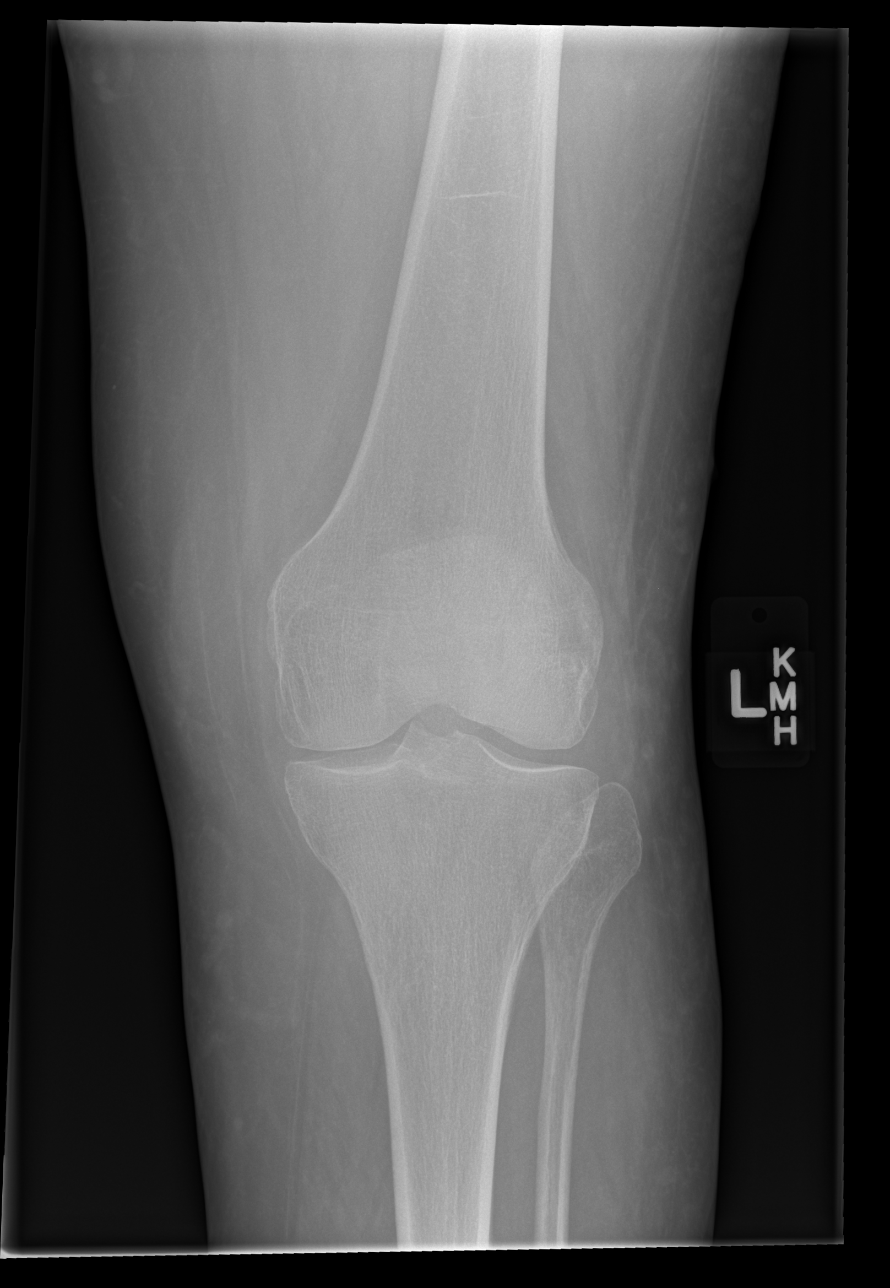

[x knee ap left (2 of 3)]
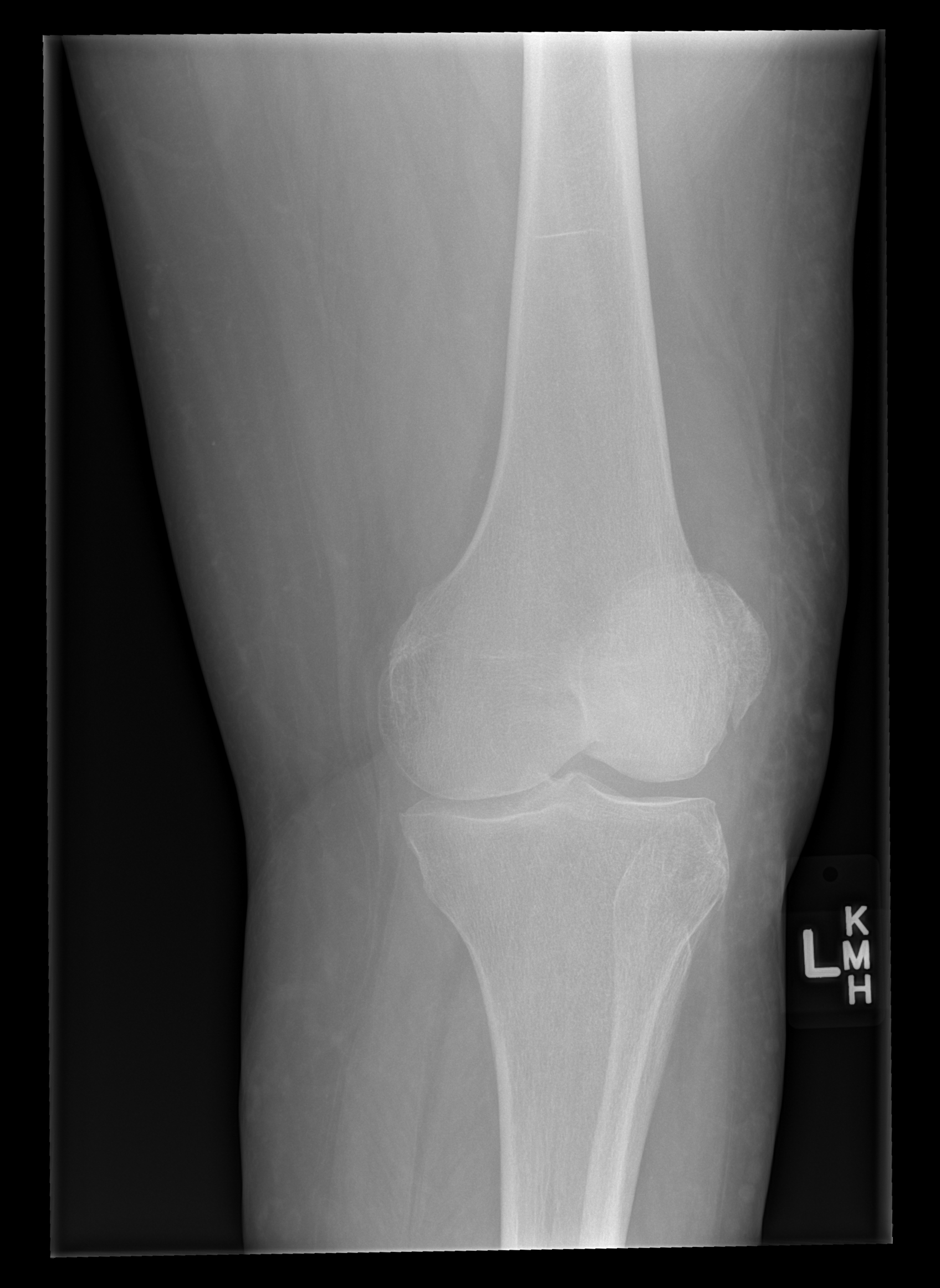

[x knee ap left (3 of 3)]
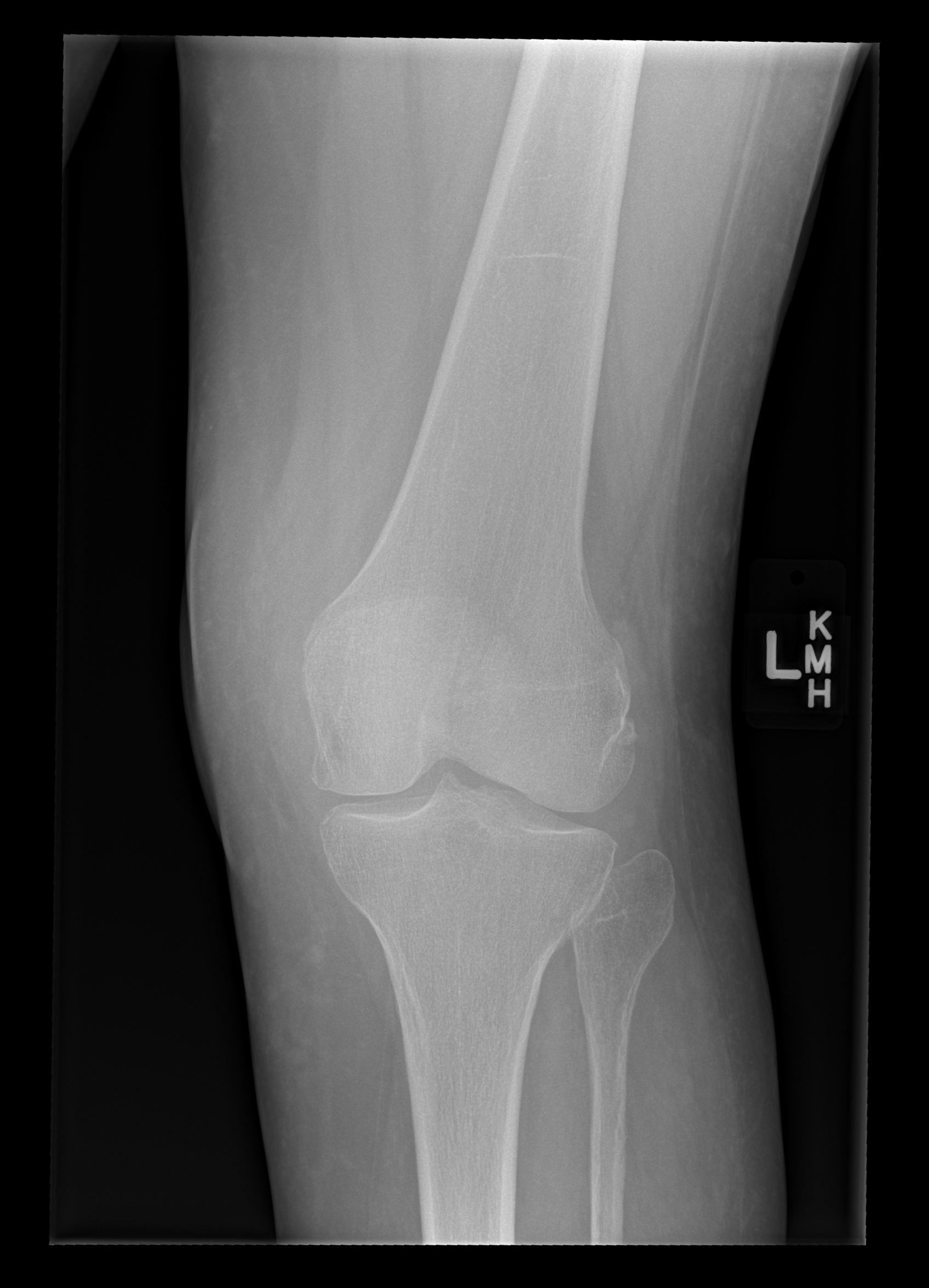

[x knee lat left]
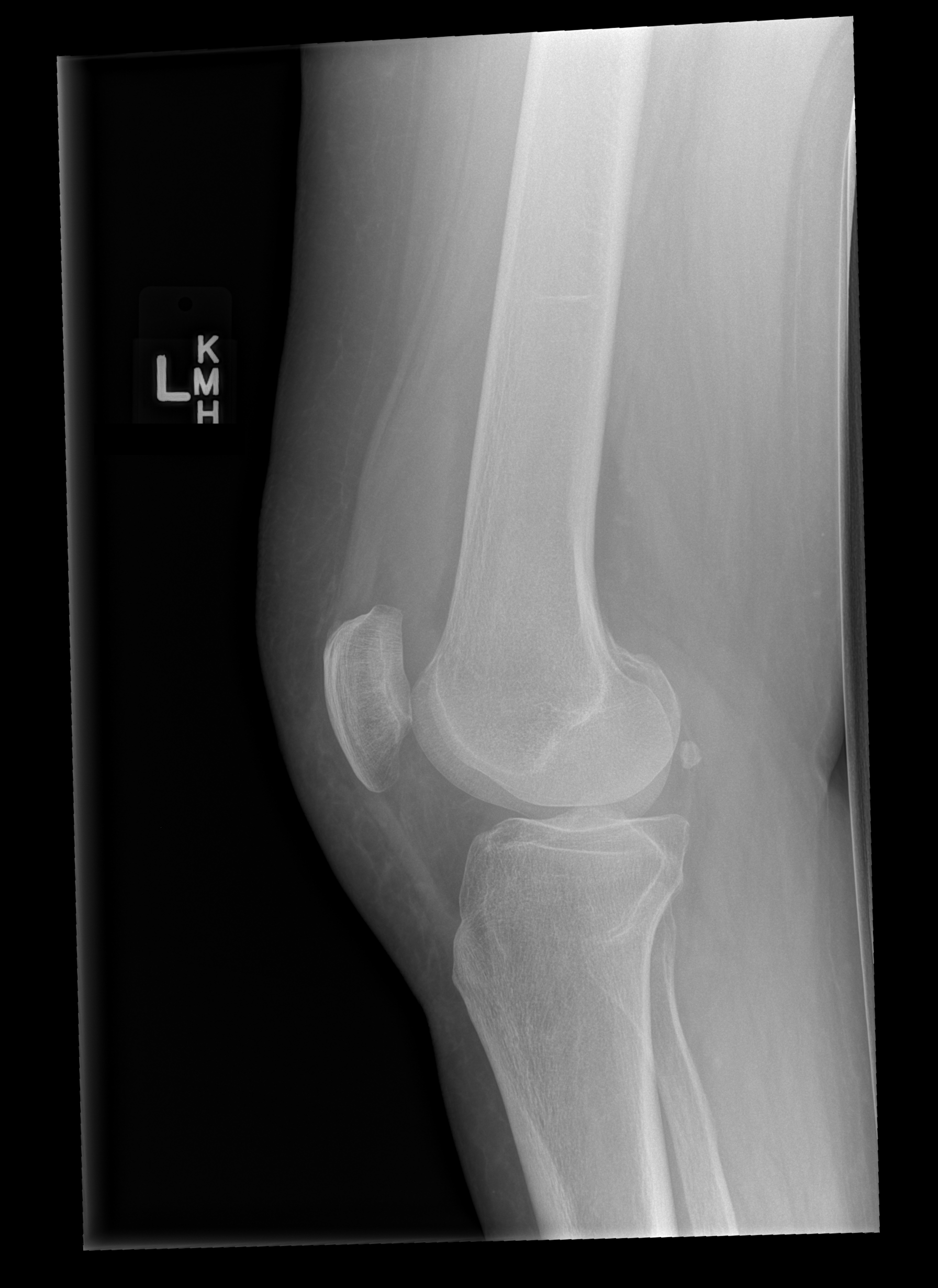

[4 of 4 positions shown; findings below may reference images not displayed]

FINDINGS: There is no acute fracture or dislocation. The bones are osteopenia.
There is a small suprapatellar effusion. The soft tissues are
grossly unremarkable with noted a foreign object.
IMPRESSION: No fracture or dislocation.

## 2017-05-26 DIAGNOSIS — M76821 Posterior tibial tendinitis, right leg: Secondary | ICD-10-CM | POA: Diagnosis not present

## 2017-05-26 DIAGNOSIS — M7061 Trochanteric bursitis, right hip: Secondary | ICD-10-CM | POA: Diagnosis not present

## 2017-05-26 DIAGNOSIS — M76822 Posterior tibial tendinitis, left leg: Secondary | ICD-10-CM | POA: Diagnosis not present

## 2017-05-26 DIAGNOSIS — M1711 Unilateral primary osteoarthritis, right knee: Secondary | ICD-10-CM | POA: Diagnosis not present

## 2017-06-21 DIAGNOSIS — W19XXXA Unspecified fall, initial encounter: Secondary | ICD-10-CM | POA: Diagnosis not present

## 2017-06-21 DIAGNOSIS — S2241XA Multiple fractures of ribs, right side, initial encounter for closed fracture: Secondary | ICD-10-CM | POA: Diagnosis not present

## 2017-06-21 DIAGNOSIS — R0781 Pleurodynia: Secondary | ICD-10-CM | POA: Diagnosis not present

## 2017-06-21 DIAGNOSIS — R918 Other nonspecific abnormal finding of lung field: Secondary | ICD-10-CM | POA: Diagnosis not present

## 2017-07-09 DIAGNOSIS — L84 Corns and callosities: Secondary | ICD-10-CM | POA: Diagnosis not present

## 2017-07-09 DIAGNOSIS — I739 Peripheral vascular disease, unspecified: Secondary | ICD-10-CM | POA: Diagnosis not present

## 2017-07-09 DIAGNOSIS — L603 Nail dystrophy: Secondary | ICD-10-CM | POA: Diagnosis not present

## 2017-07-10 DIAGNOSIS — R7309 Other abnormal glucose: Secondary | ICD-10-CM | POA: Diagnosis not present

## 2017-07-10 DIAGNOSIS — N183 Chronic kidney disease, stage 3 (moderate): Secondary | ICD-10-CM | POA: Diagnosis not present

## 2017-07-10 DIAGNOSIS — E538 Deficiency of other specified B group vitamins: Secondary | ICD-10-CM | POA: Diagnosis not present

## 2017-07-10 DIAGNOSIS — E785 Hyperlipidemia, unspecified: Secondary | ICD-10-CM | POA: Diagnosis not present

## 2017-07-10 DIAGNOSIS — F3342 Major depressive disorder, recurrent, in full remission: Secondary | ICD-10-CM | POA: Diagnosis not present

## 2017-07-10 DIAGNOSIS — K219 Gastro-esophageal reflux disease without esophagitis: Secondary | ICD-10-CM | POA: Diagnosis not present

## 2017-07-10 DIAGNOSIS — E559 Vitamin D deficiency, unspecified: Secondary | ICD-10-CM | POA: Diagnosis not present

## 2017-07-10 DIAGNOSIS — D631 Anemia in chronic kidney disease: Secondary | ICD-10-CM | POA: Diagnosis not present

## 2017-07-10 DIAGNOSIS — Z23 Encounter for immunization: Secondary | ICD-10-CM | POA: Diagnosis not present

## 2017-08-06 DIAGNOSIS — G629 Polyneuropathy, unspecified: Secondary | ICD-10-CM | POA: Diagnosis not present

## 2017-10-19 ENCOUNTER — Ambulatory Visit (INDEPENDENT_AMBULATORY_CARE_PROVIDER_SITE_OTHER): Payer: Medicare Other | Admitting: Neurology

## 2017-10-19 ENCOUNTER — Encounter (INDEPENDENT_AMBULATORY_CARE_PROVIDER_SITE_OTHER): Payer: Self-pay

## 2017-10-19 ENCOUNTER — Encounter: Payer: Self-pay | Admitting: Neurology

## 2017-10-19 VITALS — BP 128/71 | HR 70 | Ht 67.0 in | Wt 206.0 lb

## 2017-10-19 DIAGNOSIS — G5603 Carpal tunnel syndrome, bilateral upper limbs: Secondary | ICD-10-CM | POA: Diagnosis not present

## 2017-10-19 DIAGNOSIS — R7309 Other abnormal glucose: Secondary | ICD-10-CM

## 2017-10-19 DIAGNOSIS — E538 Deficiency of other specified B group vitamins: Secondary | ICD-10-CM

## 2017-10-19 DIAGNOSIS — G609 Hereditary and idiopathic neuropathy, unspecified: Secondary | ICD-10-CM

## 2017-10-19 DIAGNOSIS — G603 Idiopathic progressive neuropathy: Secondary | ICD-10-CM | POA: Diagnosis not present

## 2017-10-19 DIAGNOSIS — R269 Unspecified abnormalities of gait and mobility: Secondary | ICD-10-CM

## 2017-10-19 DIAGNOSIS — R2689 Other abnormalities of gait and mobility: Secondary | ICD-10-CM | POA: Diagnosis not present

## 2017-10-19 NOTE — Patient Instructions (Addendum)
-  May consider daily alpha lipoic acid which is an antioxidant that may reduce free radical oxidative stress associated with diabetic polyneuropathy, existing evidence suggests that alpha lipoic acid significantly reduces stabbing, lancinating and burning pain and diabetic neuropathy with its onset of action as early as 1-2 weeks. 400-600mg  twice daily.  - Neuropathy labs  - Send PT to your house  - Referral for orthotics to keep you from tripping  - emg/ncs of the upper extremities  - Recommend driving test

## 2017-10-19 NOTE — Progress Notes (Signed)
[facility]    Provider:  Dr Jaynee Eagles Referring Provider: Pedro Earls, MD Primary Care Physician:  Pedro Earls, MD  CC:  neuropathy  HPI:  Karolyne Timmons is a 84 y.o. female here as a referral from Dr. Delilah Shan for neuropathy. PMHx neuropathy, knee pain, HLD, CAD. She has a history of cancer in her 70s and chemotherapy. She stopped driving because of spatial issues with her foot. She is here with her daughter who also provides more information.  Discussed driving, I recommend formal testing. She feels her neuropathy worsens when she is upset. She feels her hand grip is weak and she has numbness in her hands. She has more numbness. No Pain. Ginko Biloba makes it better. No pain. No history of alcohol. She feels her neuropathy has improved with ginko biloba. She has a hx of b12 neuropathy. Numbness is continuous in the feet. Unclear when it started maybe with chemoitherapy. .She has weakness in the feet. She gets cramps in the hands and feet. Magnesium helps and feels it improves.   Reviewed notes, labs and imaging from outside physicians, which showed:   Reviewed nerve conduction study EMG performed at an outside facility.  Showed axonal sensory motor neuropathy.  However the sensory raise and motor conductions were diminished likely causing prolonged latency not in the demyelinating range or concerning for demyelination.  Reviewed [facility].  She was seen for a hip problem.  She has foot pain.  Her feet and ankles feel better after custom orthotics.  She has some reported weakness in both legs.  She does have a history of significant neuropathy.  Has noticed progression.She has bilateral foot drop.  Review of Systems: Patient complains of symptoms per HPI as well as the following symptoms: cramps. Pertinent negatives and positives per HPI. All others negative.   Social History   Socioeconomic History  . Marital status: Divorced    Spouse name: Not on  file  . Number of children: Not on file  . Years of education: Not on file  . Highest education level: Not on file  Social Needs  . Financial resource strain: Not on file  . Food insecurity - worry: Not on file  . Food insecurity - inability: Not on file  . Transportation needs - medical: Not on file  . Transportation needs - non-medical: Not on file  Occupational History  . Not on file  Tobacco Use  . Smoking status: Former Research scientist (life sciences)  . Smokeless tobacco: Never Used  Substance and Sexual Activity  . Alcohol use: No  . Drug use: No  . Sexual activity: Not on file  Other Topics Concern  . Not on file  Social History Narrative  . Not on file    Family History  Problem Relation Age of Onset  . Cancer Mother   . Cancer Father   . Coronary artery disease Father     Past Medical History:  Diagnosis Date  . Cancer [facility])    breast  . Coronary artery disease   . GERD (gastroesophageal reflux disease)   . Hyperlipidemia with target low density lipoprotein (LDL) cholesterol less than 100 mg/dL   . Knee pain, left   . Neuropathy     Past Surgical History:  Procedure Laterality Date  . ABDOMINAL HYSTERECTOMY    . COLONOSCOPY W/ POLYPECTOMY    . eye Left   . FOOT ARTHRODESIS Left   . MASTECTOMY      Current Outpatient Medications  Medication Sig  Dispense Refill  . diclofenac sodium (VOLTAREN) 1 % GEL Apply 4 g topically 4 (four) times daily. Rub in to your right knee joint as prescribed 1 Tube 0  . esomeprazole (NEXIUM) 40 MG capsule Take 40 mg by mouth daily before breakfast.    . Ginkgo Biloba 40 MG TABS Take 120 mg by mouth daily.    . sertraline (ZOLOFT) 50 MG tablet Take 25 mg by mouth daily.    . Vitamin D, Ergocalciferol, (DRISDOL) 50000 units CAPS capsule TAKE 1 CAPSULE ONCE A WEEK     No current facility-administered medications for this visit.     Allergies as of 10/19/2017 - Review Complete 10/19/2017  Allergen Reaction Noted  . Lortab  [hydrocodone-acetaminophen]  04/12/2012  . Sulfa antibiotics  04/12/2012  . Sulfur Anaphylaxis 06/21/2017  . Reclast [zoledronic acid]  04/12/2012  . Statins  04/12/2012    Vitals: BP 128/71   Pulse 70   Ht 5' 7"  (1.702 m)   Wt 206 lb (93.4 kg)   BMI 32.26 kg/m  Last Weight:  Wt Readings from Last 1 Encounters:  10/19/17 206 lb (93.4 kg)   Last Height:   Ht Readings from Last 1 Encounters:  10/19/17 5' 7"  (1.702 m)    Physical exam: Exam: Gen: NAD, conversant, well nourised, obese, well groomed                     CV: RRR, no MRG. No Carotid Bruits. No peripheral edema, warm, nontender Eyes: Conjunctivae clear without exudates or hemorrhage  Neuro: Detailed Neurologic Exam  Speech:    Speech is normal; fluent and spontaneous with normal comprehension.  Cognition:    The patient is oriented to person, place, and time;     recent and remote memory intact;     language fluent;     normal attention, concentration,     fund of knowledge Cranial Nerves:    Left pupil surgical, right pupil round, and reactive to light. Attempted fundoscopic exam but coul dnot visualize.  . Visual fields are full to finger confrontation. Extraocular movements are intact. Trigeminal sensation is intact and the muscles of mastication are normal. The face is symmetric. The palate elevates in the midline. Hearing intact. Voice is normal. Shoulder shrug is normal. The tongue has normal motion without fasciculations.   Coordination:    Normal finger to nose  Gait:    Steppage gait with a walker  Motor Observation:    No asymmetry, no atrophy, and no involuntary movements noted. Tone:    Normal muscle tone.    Posture:    stooped    Strength: Mild prox extremity weakness in the LE and bilateral foot drop. Otherwise strength is V/V in the upper and lower limbs.      Sensation: significantly decreased to all modalities in a glove and stocking distribution     Reflex Exam:  DTR's: Absent  Ajs    Toes:    The toes are downgoing bilaterally.   Clonus:    Clonus is absent.      Assessment/Plan:  Patient with peripheral neuropathy. Multiple risk factors include chemotherapy, b12 deficiency.   - Will order neuropathy labs  - For foot drop and falls due to this recommend orthotics to hold feet in dorsiflexion. Recommend stopping driving.  - Fall risk, discussed fall prescautions, will order home therapy for imbalance strengthening, balance issues, gait abnormality and also home inspection  - recommend formal driving testing and not driving  until then, gave information for family to call and schedule  - emg/ncs of the bilateral upper extremities for CTS   Orders Placed This Encounter  Procedures  . B12 and Folate Panel  . Multiple Myeloma Panel (SPEP&IFE w/QIG)  . Hemoglobin A1c  . Methylmalonic acid, serum  . Ambulatory referral to Home Health  . NCV with EMG(electromyography)   Cc: Pedro Earls, MD  Sarina Ill, MD  [facility] [address] [place], [place] [address]  Phone [phone] Fax [phone]

## 2017-10-20 ENCOUNTER — Encounter: Payer: Self-pay | Admitting: Neurology

## 2017-10-20 DIAGNOSIS — G629 Polyneuropathy, unspecified: Secondary | ICD-10-CM | POA: Insufficient documentation

## 2017-10-21 LAB — MULTIPLE MYELOMA PANEL, SERUM
ALBUMIN SERPL ELPH-MCNC: 3.6 g/dL (ref 2.9–4.4)
ALBUMIN/GLOB SERPL: 1.1 (ref 0.7–1.7)
ALPHA 1: 0.3 g/dL (ref 0.0–0.4)
Alpha2 Glob SerPl Elph-Mcnc: 0.8 g/dL (ref 0.4–1.0)
B-GLOBULIN SERPL ELPH-MCNC: 1.1 g/dL (ref 0.7–1.3)
GAMMA GLOB SERPL ELPH-MCNC: 1.2 g/dL (ref 0.4–1.8)
Globulin, Total: 3.3 g/dL (ref 2.2–3.9)
IGG (IMMUNOGLOBIN G), SERUM: 1027 mg/dL (ref 700–1600)
IgA/Immunoglobulin A, Serum: 246 mg/dL (ref 64–422)
IgM (Immunoglobulin M), Srm: 85 mg/dL (ref 26–217)
Total Protein: 6.9 g/dL (ref 6.0–8.5)

## 2017-10-21 LAB — HEMOGLOBIN A1C
Est. average glucose Bld gHb Est-mCnc: 94 mg/dL
Hgb A1c MFr Bld: 4.9 % (ref 4.8–5.6)

## 2017-10-21 LAB — METHYLMALONIC ACID, SERUM: Methylmalonic Acid: 735 nmol/L — ABNORMAL HIGH (ref 0–378)

## 2017-10-21 LAB — B12 AND FOLATE PANEL
Folate: 6.5 ng/mL (ref >3.0)
Vitamin B-12: 364 pg/mL (ref 232–1245)

## 2017-10-22 ENCOUNTER — Telehealth: Payer: Self-pay | Admitting: *Deleted

## 2017-10-22 NOTE — Telephone Encounter (Signed)
Called patient and discussed lab results. Beverly Jacobs is aware that Beverly Jacobs has a mild B12 deficiency and that this can worsen memory. Dr. Jaynee Eagles recommends 1000-2000 mcg oral B12 daily. The patient verbalized understanding and wrote down what to start taking. Beverly Jacobs said Beverly Jacobs will get it tomorrow. I asked her if Beverly Jacobs would like for me to call her daughter as well and Beverly Jacobs politely declined.

## 2017-10-22 NOTE — Telephone Encounter (Signed)
-----   Message from Melvenia Beam, MD sent at 10/22/2017  1:09 PM EST ----- She has some mild B12 deficiency. This can worsen memory. Recommend 1000-2021mcg daily thanks

## 2017-10-26 ENCOUNTER — Telehealth: Payer: Self-pay | Admitting: *Deleted

## 2017-10-26 DIAGNOSIS — M25562 Pain in left knee: Secondary | ICD-10-CM | POA: Diagnosis not present

## 2017-10-26 DIAGNOSIS — Z853 Personal history of malignant neoplasm of breast: Secondary | ICD-10-CM | POA: Diagnosis not present

## 2017-10-26 DIAGNOSIS — M21372 Foot drop, left foot: Secondary | ICD-10-CM | POA: Diagnosis not present

## 2017-10-26 DIAGNOSIS — G603 Idiopathic progressive neuropathy: Secondary | ICD-10-CM | POA: Diagnosis not present

## 2017-10-26 DIAGNOSIS — E538 Deficiency of other specified B group vitamins: Secondary | ICD-10-CM | POA: Diagnosis not present

## 2017-10-26 DIAGNOSIS — I2510 Atherosclerotic heart disease of native coronary artery without angina pectoris: Secondary | ICD-10-CM | POA: Diagnosis not present

## 2017-10-26 DIAGNOSIS — M21371 Foot drop, right foot: Secondary | ICD-10-CM | POA: Diagnosis not present

## 2017-10-26 DIAGNOSIS — Z9181 History of falling: Secondary | ICD-10-CM | POA: Diagnosis not present

## 2017-10-26 NOTE — Telephone Encounter (Signed)
Faxed pt's office visit notes pertaining to bilateral foot orthotics for foot drop to [employer] per request for insurance purposes. Received a receipt of confirmation.

## 2017-10-29 DIAGNOSIS — E538 Deficiency of other specified B group vitamins: Secondary | ICD-10-CM | POA: Diagnosis not present

## 2017-10-29 DIAGNOSIS — L84 Corns and callosities: Secondary | ICD-10-CM | POA: Diagnosis not present

## 2017-10-29 DIAGNOSIS — M25562 Pain in left knee: Secondary | ICD-10-CM | POA: Diagnosis not present

## 2017-10-29 DIAGNOSIS — L603 Nail dystrophy: Secondary | ICD-10-CM | POA: Diagnosis not present

## 2017-10-29 DIAGNOSIS — M21372 Foot drop, left foot: Secondary | ICD-10-CM | POA: Diagnosis not present

## 2017-10-29 DIAGNOSIS — I739 Peripheral vascular disease, unspecified: Secondary | ICD-10-CM | POA: Diagnosis not present

## 2017-10-29 DIAGNOSIS — G603 Idiopathic progressive neuropathy: Secondary | ICD-10-CM | POA: Diagnosis not present

## 2017-10-29 DIAGNOSIS — I2510 Atherosclerotic heart disease of native coronary artery without angina pectoris: Secondary | ICD-10-CM | POA: Diagnosis not present

## 2017-10-29 DIAGNOSIS — M21371 Foot drop, right foot: Secondary | ICD-10-CM | POA: Diagnosis not present

## 2017-10-29 NOTE — Telephone Encounter (Signed)
Dr. Jaynee Eagles signed an order for ankle foot orthosis and it was sent to medical records to be mailed back to [employer].

## 2017-11-03 DIAGNOSIS — M21372 Foot drop, left foot: Secondary | ICD-10-CM | POA: Diagnosis not present

## 2017-11-03 DIAGNOSIS — E538 Deficiency of other specified B group vitamins: Secondary | ICD-10-CM | POA: Diagnosis not present

## 2017-11-03 DIAGNOSIS — M21371 Foot drop, right foot: Secondary | ICD-10-CM | POA: Diagnosis not present

## 2017-11-03 DIAGNOSIS — I2510 Atherosclerotic heart disease of native coronary artery without angina pectoris: Secondary | ICD-10-CM | POA: Diagnosis not present

## 2017-11-03 DIAGNOSIS — G603 Idiopathic progressive neuropathy: Secondary | ICD-10-CM | POA: Diagnosis not present

## 2017-11-03 DIAGNOSIS — M25562 Pain in left knee: Secondary | ICD-10-CM | POA: Diagnosis not present

## 2017-11-05 ENCOUNTER — Encounter: Payer: TRICARE For Life (TFL) | Admitting: Neurology

## 2017-11-05 DIAGNOSIS — M21372 Foot drop, left foot: Secondary | ICD-10-CM | POA: Diagnosis not present

## 2017-11-05 DIAGNOSIS — M21371 Foot drop, right foot: Secondary | ICD-10-CM | POA: Diagnosis not present

## 2017-11-05 DIAGNOSIS — I2510 Atherosclerotic heart disease of native coronary artery without angina pectoris: Secondary | ICD-10-CM | POA: Diagnosis not present

## 2017-11-05 DIAGNOSIS — E538 Deficiency of other specified B group vitamins: Secondary | ICD-10-CM | POA: Diagnosis not present

## 2017-11-05 DIAGNOSIS — G603 Idiopathic progressive neuropathy: Secondary | ICD-10-CM | POA: Diagnosis not present

## 2017-11-05 DIAGNOSIS — M25562 Pain in left knee: Secondary | ICD-10-CM | POA: Diagnosis not present

## 2017-11-10 DIAGNOSIS — I2510 Atherosclerotic heart disease of native coronary artery without angina pectoris: Secondary | ICD-10-CM | POA: Diagnosis not present

## 2017-11-10 DIAGNOSIS — M21371 Foot drop, right foot: Secondary | ICD-10-CM | POA: Diagnosis not present

## 2017-11-10 DIAGNOSIS — M21372 Foot drop, left foot: Secondary | ICD-10-CM | POA: Diagnosis not present

## 2017-11-10 DIAGNOSIS — M25562 Pain in left knee: Secondary | ICD-10-CM | POA: Diagnosis not present

## 2017-11-10 DIAGNOSIS — E538 Deficiency of other specified B group vitamins: Secondary | ICD-10-CM | POA: Diagnosis not present

## 2017-11-10 DIAGNOSIS — G603 Idiopathic progressive neuropathy: Secondary | ICD-10-CM | POA: Diagnosis not present

## 2017-11-12 ENCOUNTER — Telehealth: Payer: Self-pay | Admitting: *Deleted

## 2017-11-12 NOTE — Telephone Encounter (Signed)
Faxed signed plan of care papers for PT. Received a receipt of confirmation.

## 2017-11-13 DIAGNOSIS — I2510 Atherosclerotic heart disease of native coronary artery without angina pectoris: Secondary | ICD-10-CM | POA: Diagnosis not present

## 2017-11-13 DIAGNOSIS — M25562 Pain in left knee: Secondary | ICD-10-CM | POA: Diagnosis not present

## 2017-11-13 DIAGNOSIS — E538 Deficiency of other specified B group vitamins: Secondary | ICD-10-CM | POA: Diagnosis not present

## 2017-11-13 DIAGNOSIS — M21371 Foot drop, right foot: Secondary | ICD-10-CM | POA: Diagnosis not present

## 2017-11-13 DIAGNOSIS — M21372 Foot drop, left foot: Secondary | ICD-10-CM | POA: Diagnosis not present

## 2017-11-13 DIAGNOSIS — G603 Idiopathic progressive neuropathy: Secondary | ICD-10-CM | POA: Diagnosis not present

## 2017-11-16 DIAGNOSIS — M21372 Foot drop, left foot: Secondary | ICD-10-CM | POA: Diagnosis not present

## 2017-11-16 DIAGNOSIS — I2510 Atherosclerotic heart disease of native coronary artery without angina pectoris: Secondary | ICD-10-CM | POA: Diagnosis not present

## 2017-11-16 DIAGNOSIS — G603 Idiopathic progressive neuropathy: Secondary | ICD-10-CM | POA: Diagnosis not present

## 2017-11-16 DIAGNOSIS — M21371 Foot drop, right foot: Secondary | ICD-10-CM | POA: Diagnosis not present

## 2017-11-16 DIAGNOSIS — M25562 Pain in left knee: Secondary | ICD-10-CM | POA: Diagnosis not present

## 2017-11-16 DIAGNOSIS — E538 Deficiency of other specified B group vitamins: Secondary | ICD-10-CM | POA: Diagnosis not present

## 2017-11-18 ENCOUNTER — Encounter: Payer: Self-pay | Admitting: Neurology

## 2017-11-19 ENCOUNTER — Encounter: Payer: TRICARE For Life (TFL) | Admitting: Neurology

## 2017-11-24 DIAGNOSIS — M25562 Pain in left knee: Secondary | ICD-10-CM | POA: Diagnosis not present

## 2017-11-24 DIAGNOSIS — I2510 Atherosclerotic heart disease of native coronary artery without angina pectoris: Secondary | ICD-10-CM | POA: Diagnosis not present

## 2017-11-24 DIAGNOSIS — M21372 Foot drop, left foot: Secondary | ICD-10-CM | POA: Diagnosis not present

## 2017-11-24 DIAGNOSIS — G603 Idiopathic progressive neuropathy: Secondary | ICD-10-CM | POA: Diagnosis not present

## 2017-11-24 DIAGNOSIS — E538 Deficiency of other specified B group vitamins: Secondary | ICD-10-CM | POA: Diagnosis not present

## 2017-11-24 DIAGNOSIS — M21371 Foot drop, right foot: Secondary | ICD-10-CM | POA: Diagnosis not present

## 2017-12-17 ENCOUNTER — Ambulatory Visit (INDEPENDENT_AMBULATORY_CARE_PROVIDER_SITE_OTHER): Payer: Medicare Other | Admitting: Neurology

## 2017-12-17 DIAGNOSIS — G629 Polyneuropathy, unspecified: Secondary | ICD-10-CM | POA: Diagnosis not present

## 2017-12-17 DIAGNOSIS — Z0289 Encounter for other administrative examinations: Secondary | ICD-10-CM

## 2017-12-17 NOTE — Progress Notes (Signed)
Full Name: Beverly Jacobs Gender: Female MRN #: [id number] Date of Birth: July 26, 1934    Visit Date: 12/17/2017 09:18 Age: 84 Years 2 Months Old Examining Physician: Sarina Ill, MD  Referring Physician: Jaynee Eagles, MD  History:  Patient with peripheral neuropathy. Multiple risk factors include chemotherapy, b12 deficiency.  Summary: The right median motor nerve showed delayed distal onset latency (4.8 ms) and decreased amplitude (2.8 mV).  The left median motor nerve showed decreased amplitude (3.5 mV).  The right radial sensory nerve showed delayed distal peak latency (3.5 ms) and decreased amplitude (13 V). The left radial sensory nerve showed delayed distal peak latency (3.3 ms) and decreased amplitude (5 V). The left median/ulnar (palm) comparison nerve showed prolonged distal peak latency (Median Palm, 2.7 ms, N<2.2), (Ulnar Palm, 2.4 ms, N<2.2) and normal peak latency difference (Median Palm-Ulnar Palm, 0.3 ms, N<=0.4) with a relative median delay. The right median/ulnar (palm) comparison nerve showed prolonged distal peak latency (Median Palm, 2.8 ms, N<2.2), (Ulnar Palm, 2.4 ms, N<2.2) and normal peak latency difference (Median Palm-Ulnar Palm, 0.4 ms, N<=0.4) with a relative median delay.  The bilateral median and bilateral ulnar orthodromic sensory nerves showed no response.     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  No suggestion of carpal tunnel syndrome.  Sarina Ill M.D.  [facility] [place],  [address] Tel: [phone] Fax: [phone]        [facility]    Nerve / Sites Muscle Latency Ref. Amplitude Ref. Rel Amp Segments Distance Velocity Ref. Area    ms ms mV mV %  cm m/s m/s mVms  R Median - APB     Wrist APB 4.8 ?4.4 2.8 ?4.0 100 Wrist - APB 7   7.8     Upper arm APB 8.9  2.7  97.4 Upper arm - Wrist 21 52 ?49 8.1  L Median - APB     Wrist APB 3.3 ?4.4 3.5 ?4.0 100 Wrist - APB 7    14.0     Upper arm APB 7.3  3.3  92.1 Upper arm - Wrist 21 52 ?49 12.0  R Ulnar - ADM     Wrist ADM 2.5 ?3.3 8.8 ?6.0 100 Wrist - ADM 7   18.1     B.Elbow ADM 6.1  7.4  83.5 B.Elbow - Wrist 18 50 ?49 18.1     A.Elbow ADM 8.3  6.5  88.3 A.Elbow - B.Elbow 12 54 ?49 16.6         A.Elbow - Wrist      L Ulnar - ADM     Wrist ADM 2.8 ?3.3 6.9 ?6.0 100 Wrist - ADM 7   15.4     B.Elbow ADM 6.3  5.5  79.4 B.Elbow - Wrist 18 51 ?49 13.9     A.Elbow ADM 8.3  5.6  101 A.Elbow - B.Elbow 10 49 ?49 14.5         A.Elbow - Wrist                 SNC    Nerve / Sites Rec. Site Peak Lat Ref.  Amp Ref. Segments Distance Peak Diff Ref.    ms ms V V  cm ms ms  R Radial - Anatomical snuff box (Forearm)     Forearm Wrist 3.5 ?2.9 13 ?15 Forearm - Wrist 10    L Radial - Anatomical snuff box (Forearm)     Forearm  Wrist 3.3 ?2.9 5 ?15 Forearm - Wrist 10    L Median, Ulnar - Transcarpal comparison     Median Palm Wrist 2.7 ?2.2 3 ?35 Median Palm - Wrist 8       Ulnar Palm Wrist 2.4 ?2.2 3 ?12 Ulnar Palm - Wrist 8          Median Palm - Ulnar Palm  0.3 ?0.4  R Median, Ulnar - Transcarpal comparison     Median Palm Wrist 2.8 ?2.2 3 ?35 Median Palm - Wrist 8       Ulnar Palm Wrist 2.4 ?2.2 4 ?12 Ulnar Palm - Wrist 8          Median Palm - Ulnar Palm  0.4 ?0.4  R Median - Orthodromic (Dig II, Mid palm)     Dig II Wrist NR ?3.4 NR ?10 Dig II - Wrist 13    L Median - Orthodromic (Dig II, Mid palm)     Dig II Wrist NR ?3.4 NR ?10 Dig II - Wrist 13    R Ulnar - Orthodromic, (Dig V, Mid palm)     Dig V Wrist NR ?3.1 NR ?5 Dig V - Wrist 11    L Ulnar - Orthodromic, (Dig V, Mid palm)     Dig V Wrist NR ?3.1 NR ?5 Dig V - Wrist 34                       F  Wave    Nerve F Lat Ref.   ms ms  R Ulnar - ADM 28.0 ?32.0  L Ulnar - ADM 28.8 ?32.0         EMG full       EMG Summary Table    Spontaneous MUAP Recruitment  Muscle IA Fib PSW Fasc Other Amp Dur. Poly Pattern  L. Deltoid Normal None None None _______  Normal Normal Normal Normal  L. Triceps brachii Normal None None None _______ Normal Normal Normal Normal  L. Pronator teres Normal None None None _______ Normal Normal Normal Normal  L. First dorsal interosseous Normal None None None _______ Normal Normal Normal Normal  L. Opponens pollicis Normal None None None _______ Normal Normal Normal Normal

## 2017-12-17 NOTE — Progress Notes (Signed)
See procedure note.

## 2017-12-21 NOTE — Procedures (Signed)
Full Name: Beverly Jacobs Gender: Female MRN #: [id number] Date of Birth: 06/08/1934    Visit Date: 12/17/2017 09:18 Age: 84 Years 2 Months Old Examining Physician: Sarina Ill, MD  Referring Physician: Jaynee Eagles, MD  History:  Patient with peripheral neuropathy. Multiple risk factors include chemotherapy, b12 deficiency.  Summary: The right median motor nerve showed delayed distal onset latency (4.8 ms) and decreased amplitude (2.8 mV).  The left median motor nerve showed decreased amplitude (3.5 mV).  The right radial sensory nerve showed delayed distal peak latency (3.5 ms) and decreased amplitude (13 V). The left radial sensory nerve showed delayed distal peak latency (3.3 ms) and decreased amplitude (5 V). The left median/ulnar (palm) comparison nerve showed prolonged  distal peak latency (Median Palm, 2.7 ms, N<2.2), (Ulnar Palm, 2.4 ms, N<2.2) and normal peak latency difference (Median Palm-Ulnar Palm, 0.3 ms, N<=0.4) with a relative median delay. The right median/ulnar (palm) comparison nerve showed prolonged distal peak latency (Median Palm, 2.8 ms, N<2.2), (Ulnar Palm, 2.4 ms, N<2.2) and normal peak latency difference (Median Palm-Ulnar Palm, 0.4 ms, N<=0.4) with a relative median delay.  The bilateral median and bilateral ulnar orthodromic sensory nerves showed no response.     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  No suggestion of carpal tunnel syndrome.  Sarina Ill M.D.  [facility] [place], [place] [address] Tel: [phone] Fax: [phone]        [facility]    Nerve / Sites Muscle Latency Ref. Amplitude Ref. Rel Amp Segments Distance Velocity Ref. Area    ms ms mV mV %  cm m/s m/s mVms  R Median - APB     Wrist APB 4.8 ?4.4 2.8 ?4.0 100 Wrist - APB 7   7.8     Upper arm APB 8.9  2.7  97.4 Upper arm - Wrist 21 52 ?49 8.1  L Median - APB     Wrist APB 3.3 ?4.4 3.5 ?4.0 100 Wrist - APB 7    14.0     Upper arm APB 7.3  3.3  92.1 Upper arm - Wrist 21 52 ?49 12.0  R Ulnar - ADM     Wrist ADM 2.5 ?3.3 8.8 ?6.0 100 Wrist - ADM 7   18.1     B.Elbow ADM 6.1  7.4  83.5 B.Elbow - Wrist 18 50 ?49 18.1     A.Elbow ADM 8.3  6.5  88.3 A.Elbow - B.Elbow 12 54 ?49 16.6         A.Elbow - Wrist      L Ulnar - ADM     Wrist ADM 2.8 ?3.3 6.9 ?6.0 100 Wrist - ADM 7   15.4     B.Elbow ADM 6.3  5.5  79.4 B.Elbow - Wrist 18 51 ?49 13.9     A.Elbow ADM 8.3  5.6  101 A.Elbow - B.Elbow 10 49 ?49 14.5         A.Elbow - Wrist                 SNC    Nerve / Sites Rec. Site Peak Lat Ref.  Amp Ref. Segments Distance Peak Diff Ref.    ms ms V V  cm ms ms  R Radial - Anatomical snuff box (Forearm)     Forearm Wrist 3.5 ?2.9 13 ?15 Forearm - Wrist 10    L Radial - Anatomical snuff box (Forearm)  Forearm Wrist 3.3 ?2.9 5 ?15 Forearm - Wrist 10    L Median, Ulnar - Transcarpal comparison     Median Palm Wrist 2.7 ?2.2 3 ?35 Median Palm - Wrist 8       Ulnar Palm Wrist 2.4 ?2.2 3 ?12 Ulnar Palm - Wrist 8          Median Palm - Ulnar Palm  0.3 ?0.4  R Median, Ulnar - Transcarpal comparison     Median Palm Wrist 2.8 ?2.2 3 ?35 Median Palm - Wrist 8       Ulnar Palm Wrist 2.4 ?2.2 4 ?12 Ulnar Palm - Wrist 8          Median Palm - Ulnar Palm  0.4 ?0.4  R Median - Orthodromic (Dig II, Mid palm)     Dig II Wrist NR ?3.4 NR ?10 Dig II - Wrist 13    L Median - Orthodromic (Dig II, Mid palm)     Dig II Wrist NR ?3.4 NR ?10 Dig II - Wrist 13    R Ulnar - Orthodromic, (Dig V, Mid palm)     Dig V Wrist NR ?3.1 NR ?5 Dig V - Wrist 11    L Ulnar - Orthodromic, (Dig V, Mid palm)     Dig V Wrist NR ?3.1 NR ?5 Dig V - Wrist 64                       F  Wave    Nerve F Lat Ref.   ms ms  R Ulnar - ADM 28.0 ?32.0  L Ulnar - ADM 28.8 ?32.0         EMG full       EMG Summary Table    Spontaneous MUAP Recruitment  Muscle IA Fib PSW Fasc Other Amp Dur. Poly Pattern  L. Deltoid Normal None None None _______  Normal Normal Normal Normal  L. Triceps brachii Normal None None None _______ Normal Normal Normal Normal  L. Pronator teres Normal None None None _______ Normal Normal Normal Normal  L. First dorsal interosseous Normal None None None _______ Normal Normal Normal Normal  L. Opponens pollicis Normal None None None _______ Normal Normal Normal Normal

## 2018-01-04 DIAGNOSIS — M21371 Foot drop, right foot: Secondary | ICD-10-CM | POA: Diagnosis not present

## 2018-01-04 DIAGNOSIS — E538 Deficiency of other specified B group vitamins: Secondary | ICD-10-CM | POA: Diagnosis not present

## 2018-01-04 DIAGNOSIS — R7309 Other abnormal glucose: Secondary | ICD-10-CM | POA: Diagnosis not present

## 2018-01-04 DIAGNOSIS — E785 Hyperlipidemia, unspecified: Secondary | ICD-10-CM | POA: Diagnosis not present

## 2018-01-04 DIAGNOSIS — Z0000 Encounter for general adult medical examination without abnormal findings: Secondary | ICD-10-CM | POA: Diagnosis not present

## 2018-01-04 DIAGNOSIS — N183 Chronic kidney disease, stage 3 (moderate): Secondary | ICD-10-CM | POA: Diagnosis not present

## 2018-01-04 DIAGNOSIS — K219 Gastro-esophageal reflux disease without esophagitis: Secondary | ICD-10-CM | POA: Diagnosis not present

## 2018-01-04 DIAGNOSIS — D631 Anemia in chronic kidney disease: Secondary | ICD-10-CM | POA: Diagnosis not present

## 2018-01-04 DIAGNOSIS — F3342 Major depressive disorder, recurrent, in full remission: Secondary | ICD-10-CM | POA: Diagnosis not present

## 2018-01-04 DIAGNOSIS — E559 Vitamin D deficiency, unspecified: Secondary | ICD-10-CM | POA: Diagnosis not present

## 2018-01-04 DIAGNOSIS — M21372 Foot drop, left foot: Secondary | ICD-10-CM | POA: Diagnosis not present

## 2018-02-08 DIAGNOSIS — H26492 Other secondary cataract, left eye: Secondary | ICD-10-CM | POA: Diagnosis not present

## 2018-02-08 DIAGNOSIS — H35363 Drusen (degenerative) of macula, bilateral: Secondary | ICD-10-CM | POA: Diagnosis not present

## 2018-02-18 DIAGNOSIS — L84 Corns and callosities: Secondary | ICD-10-CM | POA: Diagnosis not present

## 2018-02-18 DIAGNOSIS — I739 Peripheral vascular disease, unspecified: Secondary | ICD-10-CM | POA: Diagnosis not present

## 2018-02-18 DIAGNOSIS — L603 Nail dystrophy: Secondary | ICD-10-CM | POA: Diagnosis not present

## 2018-05-27 DIAGNOSIS — I739 Peripheral vascular disease, unspecified: Secondary | ICD-10-CM | POA: Diagnosis not present

## 2018-05-27 DIAGNOSIS — L84 Corns and callosities: Secondary | ICD-10-CM | POA: Diagnosis not present

## 2018-05-27 DIAGNOSIS — L603 Nail dystrophy: Secondary | ICD-10-CM | POA: Diagnosis not present

## 2018-07-15 DIAGNOSIS — K219 Gastro-esophageal reflux disease without esophagitis: Secondary | ICD-10-CM | POA: Diagnosis not present

## 2018-07-15 DIAGNOSIS — F3342 Major depressive disorder, recurrent, in full remission: Secondary | ICD-10-CM | POA: Diagnosis not present

## 2018-07-15 DIAGNOSIS — E538 Deficiency of other specified B group vitamins: Secondary | ICD-10-CM | POA: Diagnosis not present

## 2018-07-15 DIAGNOSIS — E559 Vitamin D deficiency, unspecified: Secondary | ICD-10-CM | POA: Diagnosis not present

## 2018-07-15 DIAGNOSIS — D631 Anemia in chronic kidney disease: Secondary | ICD-10-CM | POA: Diagnosis not present

## 2018-07-15 DIAGNOSIS — Z23 Encounter for immunization: Secondary | ICD-10-CM | POA: Diagnosis not present

## 2018-07-15 DIAGNOSIS — E785 Hyperlipidemia, unspecified: Secondary | ICD-10-CM | POA: Diagnosis not present

## 2018-07-15 DIAGNOSIS — R7309 Other abnormal glucose: Secondary | ICD-10-CM | POA: Diagnosis not present

## 2018-07-15 DIAGNOSIS — N183 Chronic kidney disease, stage 3 (moderate): Secondary | ICD-10-CM | POA: Diagnosis not present

## 2018-08-27 DIAGNOSIS — L603 Nail dystrophy: Secondary | ICD-10-CM | POA: Diagnosis not present

## 2018-08-27 DIAGNOSIS — L84 Corns and callosities: Secondary | ICD-10-CM | POA: Diagnosis not present

## 2018-08-27 DIAGNOSIS — I739 Peripheral vascular disease, unspecified: Secondary | ICD-10-CM | POA: Diagnosis not present

## 2018-08-30 ENCOUNTER — Other Ambulatory Visit: Payer: Self-pay

## 2020-07-19 DIAGNOSIS — M7061 Trochanteric bursitis, right hip: Secondary | ICD-10-CM | POA: Insufficient documentation

## 2022-08-20 DIAGNOSIS — K529 Noninfective gastroenteritis and colitis, unspecified: Secondary | ICD-10-CM | POA: Insufficient documentation

## 2022-08-20 DIAGNOSIS — Z7409 Other reduced mobility: Secondary | ICD-10-CM | POA: Insufficient documentation

## 2022-10-13 DIAGNOSIS — N3010 Interstitial cystitis (chronic) without hematuria: Secondary | ICD-10-CM | POA: Insufficient documentation

## 2023-02-05 ENCOUNTER — Encounter (HOSPITAL_BASED_OUTPATIENT_CLINIC_OR_DEPARTMENT_OTHER): Payer: Self-pay | Admitting: Pediatrics

## 2023-02-05 ENCOUNTER — Emergency Department (HOSPITAL_BASED_OUTPATIENT_CLINIC_OR_DEPARTMENT_OTHER)
Admission: EM | Admit: 2023-02-05 | Discharge: 2023-02-05 | Disposition: A | Discharge status: Home / Self Care | Payer: Medicare Other | Attending: Emergency Medicine | Admitting: Emergency Medicine

## 2023-02-05 ENCOUNTER — Other Ambulatory Visit: Payer: Self-pay

## 2023-02-05 ENCOUNTER — Emergency Department (HOSPITAL_BASED_OUTPATIENT_CLINIC_OR_DEPARTMENT_OTHER): Payer: Medicare Other

## 2023-02-05 DIAGNOSIS — R3915 Urgency of urination: Secondary | ICD-10-CM | POA: Insufficient documentation

## 2023-02-05 DIAGNOSIS — R300 Dysuria: Secondary | ICD-10-CM | POA: Insufficient documentation

## 2023-02-05 DIAGNOSIS — K8020 Calculus of gallbladder without cholecystitis without obstruction: Secondary | ICD-10-CM | POA: Diagnosis not present

## 2023-02-05 DIAGNOSIS — K449 Diaphragmatic hernia without obstruction or gangrene: Secondary | ICD-10-CM | POA: Insufficient documentation

## 2023-02-05 DIAGNOSIS — I700 Atherosclerosis of aorta: Secondary | ICD-10-CM | POA: Insufficient documentation

## 2023-02-05 LAB — URINALYSIS, MICROSCOPIC (REFLEX)

## 2023-02-05 LAB — URINALYSIS, ROUTINE W REFLEX MICROSCOPIC
Glucose, UA: NEGATIVE mg/dL
Hgb urine dipstick: NEGATIVE
Ketones, ur: 15 mg/dL — AB
Nitrite: NEGATIVE
Protein, ur: NEGATIVE mg/dL
Specific Gravity, Urine: 1.025 (ref 1.005–1.030)
pH: 5.5 (ref 5.0–8.0)

## 2023-02-05 NOTE — ED Provider Notes (Signed)
[facility] Provider Note   CSN: [id number] Arrival date & time: 02/05/23  1006     History  Chief Complaint  Patient presents with   Dysuria    Aviyana Sonntag is a 89 y.o. female.  HPI   89 year old female presents emergency department with ongoing pelvic pain, painful urination, frequency.  She is being seen by urology as an outpatient.  Has had negative UAs.  They are considering overactive bladder, interstitial cystitis, bladder spasms.  Reportedly no ultrasound/imaging has been done.  She is tried over-the-counter medication including Azo's however her symptoms persist, mainly at night.  During the day patient states that she voids normally, does not have any dysuria.  She is never noted any hematuria.  However at night after being asleep for couple hours her symptoms are severe, persisting to the morning, keeping her from sleeping.  Resolved once she gets "moving for the day".  No fever, no diarrhea, no history of kidney stones.  Home Medications Prior to Admission medications   Medication Sig Start Date End Date Taking? Authorizing Provider  diclofenac sodium (VOLTAREN) 1 % GEL Apply 4 g topically 4 (four) times daily. Rub in to your right knee joint as prescribed 04/08/16   Antony Madura, PA-C  esomeprazole (NEXIUM) 40 MG capsule Take 40 mg by mouth daily before breakfast.    [provider]  Ginkgo Biloba 40 MG TABS Take 120 mg by mouth daily.    [provider]  sertraline (ZOLOFT) 50 MG tablet Take 25 mg by mouth daily.    [provider]  Vitamin D, Ergocalciferol, (DRISDOL) 50000 units CAPS capsule TAKE 1 CAPSULE ONCE A WEEK 04/17/17   [provider]      Allergies    Elemental sulfur, Lortab [hydrocodone-acetaminophen], Sulfa antibiotics, Reclast [zoledronic acid], and Statins    Review of Systems   Review of Systems  Constitutional:  Negative for fever.  Respiratory:  Negative for  shortness of breath.   Cardiovascular:  Negative for chest pain.  Gastrointestinal:  Negative for diarrhea and vomiting.  Genitourinary:  Positive for dyspareunia, dysuria, frequency and urgency. Negative for difficulty urinating, flank pain and hematuria.  Skin:  Negative for rash.  Neurological:  Negative for headaches.    Physical Exam Updated Vital Signs BP (!) 174/63   Pulse (!) 54   Resp 18   Ht  (1.676 m)   Wt 86.2 kg   SpO2 99%   BMI 30.67 kg/m  Physical Exam Vitals and nursing note reviewed.  Constitutional:      Appearance: Normal appearance.  HENT:     Head: Normocephalic.     Mouth/Throat:     Mouth: Mucous membranes are moist.  Cardiovascular:     Rate and Rhythm: Normal rate.  Pulmonary:     Effort: Pulmonary effort is normal. No respiratory distress.  Abdominal:     Palpations: Abdomen is soft.     Tenderness: There is no abdominal tenderness. There is no guarding.  Skin:    General: Skin is warm.  Neurological:     Mental Status: She is alert and oriented to person, place, and time. Mental status is at baseline.  Psychiatric:        Mood and Affect: Mood normal.     ED Results / Procedures / Treatments   Labs (all labs ordered are listed, but only abnormal results are displayed) Labs Reviewed  URINALYSIS, ROUTINE W REFLEX MICROSCOPIC - Abnormal; Notable  for the following components:      Result Value   Bilirubin Urine SMALL (*)    Ketones, ur 15 (*)    Leukocytes,Ua SMALL (*)    All other components within normal limits  URINALYSIS, MICROSCOPIC (REFLEX) - Abnormal; Notable for the following components:   Bacteria, UA RARE (*)    All other components within normal limits    EKG None  Radiology No results found.  Procedures Procedures    Medications Ordered in ED Medications - No data to display  ED Course/ Medical Decision Making/ A&P                             Medical Decision Making Amount and/or Complexity of Data  Reviewed Labs: ordered. Radiology: ordered.   89 year old female presents emergency department with mainly nocturnal urinary urgency, frequency and dysuria.  Currently being followed by urology as an outpatient.  Vital signs are stable.  Abdomen is benign.  Urinalysis shows no UTI here.  Looks like they are considering interstitial cystitis possibly as well as bladder spasms/overactive bladder.  CT here shows no bladder stone, compressive mass or other acute pathology.  Patient and daughter are pleased with the results of the CT scan and are comfortable with continuing their outpatient urology care.  Patient at this time appears safe and stable for discharge and close outpatient follow up. Discharge plan and strict return to ED precautions discussed, patient verbalizes understanding and agreement.        Final Clinical Impression(s) / ED Diagnoses Final diagnoses:  None    Rx / DC Orders ED Discharge Orders     None         Rozelle Logan, DO 02/05/23 1339

## 2023-02-05 NOTE — Discharge Instructions (Addendum)
You have been seen and discharged from the emergency department.  CT scan showed normal structures in regards to the bladder and urinary system.  Continue follow-up with urology and primary doctor.  You may take Azo nightly for the next 3-5 nights to see if there is relief.    Take home medications as prescribed. If you have any worsening symptoms or further concerns for your health please return to an emergency department for further evaluation.

## 2023-02-05 NOTE — ED Triage Notes (Signed)
Daughter at bedside reports ongoing issues regarding painful urination since February and seen urology already. Reports worst at night time, and has had negative urinalysis. Daughter at bedside awaiting hematology and cardiac consult as well.

## 2023-07-30 ENCOUNTER — Ambulatory Visit (INDEPENDENT_AMBULATORY_CARE_PROVIDER_SITE_OTHER): Payer: Medicare Other | Admitting: Neurology

## 2023-07-30 ENCOUNTER — Encounter: Payer: Self-pay | Admitting: Neurology

## 2023-07-30 VITALS — BP 137/64 | HR 62 | Ht 66.0 in

## 2023-07-30 DIAGNOSIS — Z7409 Other reduced mobility: Secondary | ICD-10-CM | POA: Diagnosis not present

## 2023-07-30 DIAGNOSIS — F02B18 Dementia in other diseases classified elsewhere, moderate, with other behavioral disturbance: Secondary | ICD-10-CM

## 2023-07-30 DIAGNOSIS — R634 Abnormal weight loss: Secondary | ICD-10-CM | POA: Diagnosis not present

## 2023-07-30 DIAGNOSIS — G301 Alzheimer's disease with late onset: Secondary | ICD-10-CM

## 2023-07-30 DIAGNOSIS — F0392 Unspecified dementia, unspecified severity, with psychotic disturbance: Secondary | ICD-10-CM

## 2023-07-30 NOTE — Progress Notes (Signed)
[facility]    Provider:  Dr Lucia Gaskins Referring Provider: Ashley Royalty, * Primary Care Physician:  Delfin Gant, MD  CC:  memory loss  08/02/2023: This is an 89 year old patient here for memory impairment paranoia and depression.  I reviewed [name] notes: She has a past medical history of depression, GERD, hyperlipidemia, vitamin D deficiency, anemia, chronic diarrhea, vitamin D deficiency, cognitive impairment, impaired mobility and chronic kidney disease.  She has moderate cognitive impairment.  She does not take cognitive sparing agents at this time.  Currently she is taking sertraline 50 mg for depression.  Her daughter is in the room as chaperone during visit and her daughter reports that she has changes in her memory and forgetfulness after she moved into the assisted living.  Patient reports that she had an upsetting childhood and some of the things that she went through she is not remembering more vividly and has been upsetting.  She was calm and cooperative during the visit, she says she likes people and likes to be around other people, sitting in a wheelchair, this is her transportation method secondary to decreased mobility by lower extremities.  She recently had a UTI that she was taking Keflex for noticed completed.  She is alert and awake and participates during the visit.    Patient is here with daughter, she tried Aricept and could not tolerate.  Daughter provides me a typed list which states:  Stop driving in [address] and using stairs, moved to a house across the street from daughter and husband, initially walked to her house daily to be with her dog, dog died in Oct 22, 2024steady mental decline and physically afterwards, by January 2024 we just like she is being difficult, difficulty operating phone and TV, would call to ask for help several times a day, was only eating bagels or canned foods to be warmed and microwave Supple we brought her  food, UTI symptoms started and we are running her to the doctor every 10 days or so.  In February and March behavior mood became erratic.  Unable to clean her house.  Angry few suggested she needed help.  Difficulty operating phone and TV got worse.  She fell in the shower twice in 1 week and we are out of town neighbors had a vascular.  Doctors did heart and brain scans.  UTI complaints about every 10 days.  Started in PT and OT now she refused to cooperate and refused to walk at all.  In April she called [place] we were out of town and was yelling at [place] and angry, hanging up and asked the not answering and not talking to [place], we came and really discussed this with the living with her and she consented to go she moved into assisted living in May 2024.  Since then she has steadily worsened and having difficulty operating TV and phone, and we need to get a new phone or TV or get someone to come repair them per patient.  Cannot remember names or retained who most people are other than immediate family.  Confused about where she has, reverse the thinking she is her family live in other states she has lived in the past.  Remembers places in [place] as being another states and astonished that we got there so quickly.  Sometimes will if she lives another states her lives far away from family that live locally.  Please see scanned letter for all information.  Patient complains of symptoms  per HPI as well as the following symptoms: decreased mobility . Pertinent negatives and positives per HPI. 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  No suggestion of carpal tunnel syndrome.  HPI 10/19/2017:  Khadijatou Winters is a 89 y.o. female here as a referral from Dr. Meriam Sprague for neuropathy. PMHx neuropathy, knee pain, HLD, CAD. She has a history of cancer in her 61s and chemotherapy. She stopped driving because of spatial issues with her foot.  She is here with her daughter who also provides more information.  Discussed driving, I recommend formal testing. She feels her neuropathy worsens when she is upset. She feels her hand grip is weak and she has numbness in her hands. She has more numbness. No Pain. Ginko Biloba makes it better. No pain. No history of alcohol. She feels her neuropathy has improved with ginko biloba. She has a hx of b12 neuropathy. Numbness is continuous in the feet. Unclear when it started maybe with chemoitherapy. .She has weakness in the feet. She gets cramps in the hands and feet. Magnesium helps and feels it improves.   Reviewed notes, labs and imaging from outside physicians, which showed:   Reviewed nerve conduction study EMG performed at an outside facility.  Showed axonal sensory motor neuropathy.  However the sensory raise and motor conductions were diminished likely causing prolonged latency not in the demyelinating range or concerning for demyelination.  Reviewed [facility].  She was seen for a hip problem.  She has foot pain.  Her feet and ankles feel better after custom orthotics.  She has some reported weakness in both legs.  She does have a history of significant neuropathy.  Has noticed progression.She has bilateral foot drop.  Review of Systems: Patient complains of symptoms per HPI as well as the following symptoms: cramps. Pertinent negatives and positives per HPI. All others negative.   Social History   Socioeconomic History   Marital status: Divorced    Spouse name: Not on file   Number of children: Not on file   Years of education: Not on file   Highest education level: Not on file  Occupational History   Not on file  Tobacco Use   Smoking status: Former   Smokeless tobacco: Never  Substance and Sexual Activity   Alcohol use: No   Drug use: No   Sexual activity: Not on file  Other Topics Concern   Not on file  Social History Narrative   Not on file   Social  Determinants of Health   Financial Resource Strain: Low Risk  (01/11/2023)   Received from [facility]   Overall Financial Resource Strain (CARDIA)    Difficulty of Paying Living Expenses: Not hard at all  Food Insecurity: No Food Insecurity (01/11/2023)   Received from [facility], [facility]   Hunger Vital Sign    Worried About Running Out of Food in the Last Year: Never true    Ran Out of Food in the Last Year: Never true  Transportation Needs: No Transportation Needs (01/11/2023)   Received from [facility], [facility]   PRAPARE - Transportation    Lack of Transportation (Medical): No    Lack of Transportation (Non-Medical): No  Physical Activity: Unknown (01/11/2023)   Received from [facility], [facility]   Exercise Vital Sign    Days of Exercise per Week: 0 days    Minutes of Exercise per Session: Not on  file  Stress: Patient Declined (01/11/2023)   Received from [facility] - Occupational Stress Questionnaire    Feeling of Stress : Patient declined  Social Connections: Moderately Integrated (01/11/2023)   Received from [facility], [facility]   Social Network    How would you rate your social network (family, work, friends)?: Adequate participation with social networks  Intimate Partner Violence: Not At Risk (01/11/2023)   Received from [facility], [facility]   HITS    Over the last 12 months how often did your partner physically hurt you?: 1    Over the last 12 months how often did your partner insult you or talk down to you?: 1    Over the last 12 months how often did your partner threaten you with physical harm?: 1    Over the last 12 months how often did your partner scream or curse at you?: 1    Family History  Problem Relation Age of Onset   Cancer Mother    Cancer Father    Coronary artery disease Father    Alzheimer's disease Neg Hx    Dementia Neg Hx     Past  Medical History:  Diagnosis Date   Cancer (HCC)    breast   Coronary artery disease    GERD (gastroesophageal reflux disease)    Hyperlipidemia with target low density lipoprotein (LDL) cholesterol less than 100 mg/dL    Knee pain, left    Neuropathy     Past Surgical History:  Procedure Laterality Date   ABDOMINAL HYSTERECTOMY     COLONOSCOPY W/ POLYPECTOMY     eye Left    FOOT ARTHRODESIS Left    MASTECTOMY      Current Outpatient Medications  Medication Sig Dispense Refill   acetaminophen (TYLENOL) 500 MG tablet Take 500 mg by mouth as needed.     ALPRAZolam (XANAX) 0.25 MG tablet Take 0.25 mg by mouth 2 (two) times daily as needed.     ALPRAZolam (XANAX) 0.25 MG tablet Take 1 tablet (0.25 mg total) by mouth 2 (two) times daily. 180 tablet 3   Brexpiprazole (REXULTI) 0.5 MG TABS Take 1 tablet (0.5 mg total) by mouth at bedtime. 30 tablet 11   Cholecalciferol (VITAMIN D3) 25 MCG (1000 UT) CAPS Take by mouth.     cyanocobalamin (VITAMIN B12) 1000 MCG tablet Take by mouth.     diclofenac sodium (VOLTAREN) 1 % GEL Apply 4 g topically 4 (four) times daily. Rub in to your right knee joint as prescribed 1 Tube 0   diclofenac Sodium (VOLTAREN) 1 % GEL Apply 4 g topically 4 (four) times daily. Rub in to your right knee joint as prescribed     diphenoxylate-atropine (LOMOTIL) 2.5-0.025 MG tablet Take by mouth.     Docusate Sodium (DSS) 100 MG CAPS Take by mouth.     esomeprazole (NEXIUM) 40 MG capsule Take 40 mg by mouth daily before breakfast.     esomeprazole (NEXIUM) 40 MG capsule Take by mouth.     ferrous sulfate 325 (65 FE) MG tablet Take by mouth.     Multiple Vitamins-Minerals (CENTRUM SILVER 50+WOMEN) TABS Take 1 tablet by mouth daily.     Multiple Vitamins-Minerals (PRESERVISION AREDS 2) CAPS Take 1 capsule by mouth daily.     Multiple Vitamins-Minerals (PRESERVISION AREDS 2) CAPS Take 1 tablet by mouth daily.     naproxen sodium (ALEVE) 220 MG tablet Take 220 mg  by mouth as  needed.     Phenazopyridine HCl (AZO URINARY PAIN PO) Take by mouth as needed.     prednisoLONE acetate (PRED FORTE) 1 % ophthalmic suspension      sertraline (ZOLOFT) 50 MG tablet Take 25 mg by mouth daily.     sertraline (ZOLOFT) 50 MG tablet Take by mouth.     simethicone (MYLICON) 125 MG chewable tablet Chew 125 mg by mouth every 6 (six) hours as needed for flatulence.     Vitamin D, Ergocalciferol, (DRISDOL) 1.25 MG (50000 UNIT) CAPS capsule Take by mouth.     Vitamin D, Ergocalciferol, (DRISDOL) 50000 units CAPS capsule TAKE 1 CAPSULE ONCE A WEEK     No current facility-administered medications for this visit.    Allergies as of 07/30/2023 - Review Complete 07/30/2023  Allergen Reaction Noted   Elemental sulfur Anaphylaxis 06/21/2017   Lortab [hydrocodone-acetaminophen]  04/12/2012   Sulfa antibiotics  04/12/2012   Reclast [zoledronic acid]  04/12/2012   Statins  04/12/2012    Vitals: BP 137/64   Pulse 62   Ht 5\' 6"  (1.676 m)   BMI 30.67 kg/m  Last Weight:  Wt Readings from Last 1 Encounters:  02/05/23 190 lb (86.2 kg)   Last Height:   Ht Readings from Last 1 Encounters:  07/30/23 5\' 6"  (1.676 m)   Physical exam: Exam: Gen: NAD, conversant, tangential, poor historian                  CV: RRR, no MRG. No Carotid Bruits. No peripheral edema, warm, nontender Eyes: Conjunctivae clear without exudates or hemorrhage  Neuro: Detailed Neurologic Exam  Speech:    Speech is fluent Cognition:       07/30/2023    3:31 PM  MMSE - Mini Mental State Exam  Orientation to time 0  Orientation to Place 1  Registration 3  Attention/ Calculation 1  Recall 0  Language- name 2 objects 2  Language- repeat 1  Language- follow 3 step command 3  Language- read & follow direction 1  Write a sentence 1  Copy design 0  Total score 13    Cranial Nerves:    The pupils are equal, round, and reactive to light. Cannot visualize fundi due to small puils attempted Visual fields  are full to threat. Extraocular movements are intact. Trigeminal sensation is intact and the muscles of mastication are normal. The face is symmetric. The palate elevates in the midline. Hearing intact. Voice is normal. Shoulder shrug is normal. The tongue has normal motion without fasciculations.   Coordination: No ataxia or dysmetria noted  Gait:  Cannot stand without assistance, in a wheelchair, imobile  Motor Observation:    No asymmetry, no atrophy, and no involuntary movements noted. Tone:    Normal muscle tone.    Posture:    Posture is normal. normal erect    Strength:    Strength is equal and antigravity in the upper and lower limbs except for bilat foot drop. Difficulty with detailed exam due to dementia, impaired concentration, difficulty following commands, foot drop bilat.     Sensation: impaired to all modalities distally     Reflex Exam:  DTR's: Absent Ajs Toes:    The toes are equiv bilaterally.   Clonus:    Clonus is absent.      Assessment/Plan:  Patient with dementia. MMSE 13/30. Explained at this time there is no itervention. Failed aricept(side effects). I do not feel namenda would make any  considerable difference but offered. Daughter and son in law provide most information and appear very knowledgeable. Can offer blood work to see if there may be alzheimer's pathology in the blood and genetic testing but difficult to ascertain etiology.   Hospice referral for weight loss, cognitive impairment, decreased mobility Can take alprazolam .25mg  bid Continue zoloft Try rexulti : Start 0.5mg  daily (other meds like Risperdal, seroquel and others) Call try and call physician/NP at faciloity  Note sent to my team:  1. Call facility and ask where to send prescriptions, they often have a pharmacy they use  2. Find out the NP/Physician name so I can call them to discuss  3. Forward my notes and the prescriptions to the facility(printed please see for alprazolam  and rexulti. If we cannot get Rexulti approved would try low-dose risperdal thanks)   Orders Placed This Encounter  Procedures   ATN PROFILE   APOE Alzheimer's Risk   Ambulatory referral to Hospice   Meds ordered this encounter  Medications   ALPRAZolam (XANAX) 0.25 MG tablet    Sig: Take 1 tablet (0.25 mg total) by mouth 2 (two) times daily.    Dispense:  180 tablet    Refill:  3   Brexpiprazole (REXULTI) 0.5 MG TABS    Sig: Take 1 tablet (0.5 mg total) by mouth at bedtime.    Dispense:  30 tablet    Refill:  11    Cc: Delfin Gant, MD  Naomie Dean, MD  [facility] [address] [place], [place] [address]  Phone [phone] Fax [phone]  I spent over 60 minutes of face-to-face and non-face-to-face time with patient on the  1. 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 Weight loss   3. Declining mobility   4. Hallucinations due to late onset dementia (HCC)    diagnosis.  This included previsit chart review, lab review, study review, order entry, electronic health record documentation, patient education on the different diagnostic and therapeutic options, counseling and coordination of care, risks and benefits of management, compliance, or risk factor reduction

## 2023-07-30 NOTE — Patient Instructions (Addendum)
Hospice referral (do not say from hospice) Can take alprazolam .25mg  bid Continue zoloft Try rexulti : Start 0.5mg  daily (other meds like Risperdal, seroquel and others) Call the physician   Brexpiprazole Tablets What is this medication? BREXPIPRAZOLE (brex PIP ray zole) treats schizophrenia. It may also be used with antidepressant medication to treat depression. It can also be used to treat agitation caused by Alzheimer disease. It works by balancing the levels of dopamine and serotonin in your brain, substances that help regulate mood, behaviors, and thoughts. It belongs to a group of medications called antipsychotics. Antipsychotic medications can be used to treat several kinds of mental health conditions. This medicine may be used for other purposes; ask your health care provider or pharmacist if you have questions. COMMON BRAND NAME(S): REXULTI What should I tell my care team before I take this medication? They need to know if you have any of these conditions: Dementia Diabetes Difficulty swallowing Have trouble controlling your muscles Have urges you are unable to control (for example, gambling, spending money, or eating) Heart disease High cholesterol History of breast cancer History of stroke Kidney disease Liver disease Low blood counts, like low white cell, platelet, or red cell counts Low blood pressure Parkinson's disease Seizures Suicidal thoughts, plans or attempt; a previous suicide attempt by you or a family member An unusual or allergic reaction to brexpiprazole, other medications, foods, dyes, or preservatives Pregnant or trying to get pregnant Breast-feeding How should I use this medication? Take this medication by mouth with water. Take it as directed on the prescription label at the same time every day. You can take it with or without food. If it upsets your stomach, take it with food. Keep taking this medication unless your care team tells you to stop. Stopping  it too quickly can cause serious side effects. It can also make your condition worse. A special MedGuide will be given to you by the pharmacist with each prescription and refill. Be sure to read this information carefully each time. Talk to your care team about the use of this medication in children. While it may be prescribed for children as young as 13 years for selected conditions, precautions do apply. Overdosage: If you think you have taken too much of this medicine contact a poison control center or emergency room at once. NOTE: This medicine is only for you. Do not share this medicine with others. What if I miss a dose? If you miss a dose, take it as soon as you can. If it is almost time for your next dose, take only that dose. Do not take double or extra doses. What may interact with this medication? Do not take this medication with any of the following: Aripiprazole Metoclopramide This medication may also interact with the following: Antihistamines for allergy, cough, and cold Certain medications for anxiety or sleep Certain medications for depression like amitriptyline, duloxetine, fluoxetine, paroxetine, sertraline Certain medications for fungal infections like fluconazole, itraconazole, ketoconazole Clarithromycin General anesthetics like halothane, isoflurane, methoxyflurane, propofol Levodopa or other medications for Parkinson's disease Medications for blood pressure Medications that relax muscles for surgery Medications for seizures Narcotic medications for pain Phenothiazines like chlorpromazine, prochlorperazine, thioridazine Quinidine Rifampin St. John's Wort This list may not describe all possible interactions. Give your health care provider a list of all the medicines, herbs, non-prescription drugs, or dietary supplements you use. Also tell them if you smoke, drink alcohol, or use illegal drugs. Some items may interact with your medicine. What should I watch  for while  using this medication? Visit your care team for regular checks on your progress. Tell your care team if symptoms do not start to get better or if they get worse. Do not stop taking except on your care team's advice. You may develop a severe reaction. Your care team will tell you how much medication to take. Patients and their families should watch out for new or worsening depression or thoughts of suicide. Also watch out for sudden changes in feelings such as feeling anxious, agitated, panicky, irritable, hostile, aggressive, impulsive, severely restless, overly excited and hyperactive, or not being able to sleep. If this happens, especially at the beginning of antidepressant treatment or after a change in dose, call your care team. You may get dizzy or drowsy. Do not drive, use machinery, or do anything that needs mental alertness until you know how this medication affects you. Do not stand or sit up quickly, especially if you are an older patient. This reduces the risk of dizzy or fainting spells. Alcohol may interfere with the effect of this medication. Avoid alcoholic drinks. There have been reports of increased sexual urges or other strong urges such as gambling while taking this medication. If you experience any of these while taking this medication, you should report this to your care team as soon as possible. This medication may cause dry eyes and blurred vision. If you wear contact lenses you may feel some discomfort. Lubricating drops may help. See your eye care team if the problem does not go away or is severe. This medication may increase blood sugar. Ask your care team if changes in diet or medications are needed if you have diabetes. This medication can cause problems with controlling your body temperature. It can lower the response of your body to cold temperatures. If possible, stay indoors during cold weather. If you must go outdoors, wear warm clothes. It can also lower the response of your  body to heat. Do not overheat. Do not over-exercise. Stay out of the sun when possible. If you must be in the sun, wear cool clothing. Drink plenty of water. If you have trouble controlling your body temperature, call your care team right away. What side effects may I notice from receiving this medication? Side effects that you should report to your care team as soon as possible: Allergic reactions--skin rash, itching, hives, swelling of the face, lips, tongue, or throat High blood sugar (hyperglycemia)--increased thirst or amount of urine, unusual weakness or fatigue, blurry vision High fever, stiff muscles, increased sweating, fast or irregular heartbeat, and confusion, which may be signs of neuroleptic malignant syndrome Infection--fever, chills, cough, sore throat Low blood pressure--dizziness, feeling faint or lightheaded, blurry vision Pain or trouble swallowing Seizures Stroke--sudden numbness or weakness of the face, arm, or leg, trouble speaking, confusion, trouble walking, loss of balance or coordination, dizziness, severe headache, change in vision Thoughts of suicide or self-harm, worsening mood, feelings of depression Uncontrolled and repetitive body movements, muscle stiffness or spasms, tremors or shaking, loss of balance or coordination, restlessness, shuffling walk, which may be signs of extrapyramidal symptoms (EPS) Urges to engage in impulsive behaviors such as gambling, binge eating, sexual activity, or shopping in ways that are unusual for you Side effects that usually do not require medical attention (report to your care team if they continue or are bothersome): Constipation Drowsiness Headache Weight gain This list may not describe all possible side effects. Call your doctor for medical advice about side effects. You may report  side effects to FDA at 1-800-FDA-1088. Where should I keep my medication? Keep out of the reach of children and pets. Store at room temperature  between 20 and 25 degrees C (68 and 77 degrees F). Get rid of any unused medication after the expiration date. To get rid of medications that are no longer needed or have expired: Take the medication to a medication take-back program. Check with your pharmacy or law enforcement to find a location. If you cannot return the medication, check the label or package insert to see if the medication should be thrown out in the garbage or flushed down the toilet. If you are not sure, ask your care team. If it is safe to put it in the trash, take the medication out of the container. Mix the medication with cat litter, dirt, coffee grounds, or other unwanted substance. Seal the mixture in a bag or container. Put it in the trash. NOTE: This sheet is a summary. It may not cover all possible information. If you have questions about this medicine, talk to your doctor, pharmacist, or health care provider.  2024 Elsevier/Gold Standard (2022-03-03 00:00:00)  Alzheimer's Disease Alzheimer's disease is a disease that affects the way your brain works. It affects memory and causes changes in how you think, talk, and act. The disease gets worse over time. Alzheimer's disease is a form of dementia. What are the causes? Alzheimer's disease happens when a protein called beta-amyloid forms deposits in the brain. It's not known what causes these to form. The disease may also be caused by a harmful change in a gene that's passed down, or inherited, from one or both parents. Not everyone who gets the changed gene from their parents will get the disease. What increases the risk? Being older than 87 years of age. Being female. Having any of these conditions: High blood pressure. Diabetes. Heart or blood vessel disease. Smoking. Being very overweight. Having a brain injury or a stroke in the past. Having a family history of dementia. What are the signs or symptoms?  Symptoms may happen in three stages, which often  overlap. Early stage In this stage, you can still do things on your own. You may still be able to drive, work, and be social. Symptoms in this stage include: Forgetting small things, like a name, words, or what you did recently. Having a hard time with: Paying attention or learning new things. Talking with people. Doing your usual tasks. Solving problems or doing math. Following instructions. Feeling worried or nervous. Not wanting to be around people. Losing interest in doing things. Moderate stage In this stage, you'll start to need care. Symptoms include: Trouble saying what you're thinking. Memory loss that affects daily life. This can include forgetting: Things that happened recently. If you've taken medicines or eaten. Places you know. You may get lost while walking or driving. To pay bills. To bathe or use the bathroom. Being confused about where you are or what time it is. Trouble judging distance. Changes in how you feel or act. You may be moody, angry, upset, scared, worried, or suspicious. Not thinking clearly or making good choices. You may have delusions or false beliefs. Hallucinations. This means you see, hear, taste, smell, or feel things that aren't real. Severe stage In the severe stage, you'll need help with your personal care and daily activities. Symptoms include: Memory loss getting worse. Personality changes. Not knowing where you are. Physical problems, like trouble walking, sitting, or swallowing. More trouble talking with  others. Not being able to control when you pee (urinate) or poop. More changes in how you act. How is this diagnosed? You may need to see a nervous system specialist, called a neurologist, or a health care provider who focuses on the care of older adults. Alzheimer's disease may be diagnosed based on: Your symptoms and medical history. Your provider will talk with you and your family, friends, and caregivers about your history and  symptoms. A physical exam. Tests. These may include: Lab tests, such as tests on your blood or pee. Imaging tests. You may have a CT scan, a PET scan, or an MRI. A lumbar puncture. For this test, a sample of the fluid around the brain and spinal cord is taken and tested. Tests to check your thinking and memory. Genetic testing. This may be done if you get the disease before age 51 or if other family members have had the disease. How is this treated? At this time, there's no cure for Alzheimer's disease. The goals of treatment are to: Manage symptoms that affect behavior. Make sure you're safe at home. Help manage daily life for you and your caregivers. Treatment may include: Medicines. You may get medicines that can: Slow down how fast the disease gets worse. Help with memory and behavior. Cognitive therapy. This gives you support, training to help with thinking skills, and memory aids. Counseling or spiritual guidance. These can help you deal with the many feelings you may have, such as fear, anger, or feeling alone. Caregivers. These are people who help you with your daily tasks. Family support groups. These allow your family members to learn about the disease, get emotional support, and find out about resources to help take care of you. Follow these instructions at home:  Medicines Take over-the-counter and prescription medicines only as told by your provider. Use a pill organizer or pill reminder to help you keep track of your medicines. Avoid taking medicines that can affect thinking, such as medicines for pain or sleep. Lifestyle Make healthy choices: Be active as told by your provider. Regular exercise may help with symptoms. Do not smoke, vape, or use products with nicotine or tobacco in them. Do not drink alcohol. Eat a healthy diet. When you feel a lot of stress, do something that helps you relax. Try things like yoga or deep breathing. Spend time with people. Drink  enough fluid to keep your pee pale yellow. Make sure you sleep well. These tips can help: Try not to take long naps during the day. Take short naps of 30 minutes or less if needed. Keep your bedroom dark and cool. Do not exercise during the few hours before you go to bed. Avoid caffeine products in the afternoon and evening. General instructions Work with your provider to decide: What things you need help with. What your safety needs are. If you were given a bracelet that tracks where you are and shows that you're a person with memory loss, make sure you wear it at all times. Talk with your provider about whether it's safe for you to drive. Work with your family to make big legal or health decisions. This may include things like advance directives, medical power of attorney, or a living will. Where to find more information There are two ways to contact the Alzheimer's Association: Call the 24-hour helpline at 202-430-9317. Visit WesternTunes.it Contact a health care provider if: Your medicine causes you to have nausea, vomiting, or trouble with eating. You have mood or behavior  changes that are getting worse, such as feeling depressed, worried, or nervous. You have hallucinations. You or your family members are worried about your safety. You're hard to wake up. Your memory suddenly gets worse. Get help right away if: You feel like you may hurt yourself or others. You have thoughts about taking your own life. Take one of these steps: Go to your nearest emergency room. Call 911. Call the National Suicide Prevention Lifeline at 469-729-4456 or 988. Text the Crisis Text Line at 973-857-9839. This information is not intended to replace advice given to you by your health care provider. Make sure you discuss any questions you have with your health care provider. Document Revised: 12/30/2022 Document Reviewed: 12/30/2022 Elsevier Patient Education  2024 Elsevier Inc.  Recommendations to prevent or  slow progression of cognitive decline:   Exercise You should increase exercise 30 to 45 minutes per day at least 3 days a week although 5 to 7 would be preferred. Any type of exercise (including walking) is acceptable although a recumbent bicycle may be best if you are unsteady. Disease related apathy can be a significant roadblock to exercise and the only way to overcome this is to make it a daily routine and perhaps have a reward at the end (something your loved one loves to eat or drink perhaps) or a personal trainer coming to the home can also be very useful. In general a structured, repetitive schedule is best.   Cardiovascular Health: You should optimize all cardiovascular risk factors (blood pressure, sugar, cholesterol) as vascular disease such as strokes and heart attacks can make memory problems much worse.   Diet: Eating a heart healthy (Mediterranean) diet is also a good idea; fish and poultry instead of red meat, nuts (mostly non-peanuts), vegetables, fruits, olive oil or canola oil (instead of butter), minimal salt (use other spices to flavor foods), whole grain rice, bread, cereal and pasta and wine in moderation.  General Health: Any diseases which effect your body will effect your brain such as a pneumonia, urinary infection, blood clot, heart attack or stroke. Keep contact with your primary care doctor for regular follow ups.  Sleep. A good nights sleep is healthy for the brain. Seven hours is recommended. If you have insomnia or poor sleep habits see the recommendations below  Tips: Structured and consistent daytime and nighttime routine, including regular wake times, bedtimes, and mealtimes, will be important for the patient to avoid confusion. Keeping frequently used items in designated places will help reduce stress from searching. If there are worries about getting lost do not let the patient leave home unaccompanied. They might benefit from wearing an identification bracelet that  will help others assist in finding home if they become lost. Information about nationwide safe return services and other helpful resources may be obtained through the Alzheimer's Association helpline at 1800-(671) [phone].  Finances, Power of Restaurant manager, fast food Directives: You should consider putting legal safeguards in place with regard to financial and medical decision making. While the spouse always has power of attorney for medical and financial issues in the absence of any form, you should consider what you want in case the spouse / caregiver is no longer around or capable of making decisions.   [place] : MovieEvening.com.au.pdf  Or Google "[facility]" AND "An Proofreader for [employer]  Other States: PainBasics.com.au  The signature on these forms should be notarized.   DRIVING:   Driving only during the day Drive only to familiar Locations Avoid driving during  bad weather  If you would like to be tested to see if you are driving safely, [facility] has a Clinical Driving Evaluation. To schedule an appointment call [phone].                RESOURCES:  Memory Loss: Improve your short term memory By Laverle Patter  The Alzheimer's Reading Room http://[email or web address]   The Alzheimer's Compendium http://www.alzcompend.info/  [employer] www.dukefamilysupport.JJO [phone]  Recommended resources for caregivers (All can be purchased on [employer]):  1) A Caregiver's Guide to Dementia: Using Activities and Other Strategies to Prevent, Reduce and Manage Behavioral Symptoms by Mahala Menghini. Bethena Midget and [employer]   2) A Caregiver's Guide to [employer] Dementia by Briant Sites MS BSN and Velia Meyer   3) What If It's Not Alzheimer's?: A Caregiver's Guide to Dementia by Neila Gear  (Author), Roberts Gaudy (Editor)  3) The 36 hour day by Rabins and Mace  4) Understanding Difficult Behaviors by Gerre Scull and White  Online course for helping caregivers reduce stress, guilt and frustration called the Caregivers Helpbook. The website is www.powerfultoolsforcaregivers.org  As a caregiver you are a Government social research officer. Problems you face as a caregiver are usually unique to your situation and the way your loved-one's disease manifests itself. The best way to use these books is to look at the Table of Contents and read any chapters of interest or that apply to challenges you are having as a caregiver.  NATIONAL RESOURCES: For more information on neurological disorders or research programs funded by the [employer] of Neurological Disorders and Stroke, contact the Institute's Gaffer (BRAIN) at: BRAIN [address] [place] [address] [phone] (toll-free) ToledoAutomobile.co.uk  Information on dementia is also available from the following organizations: Alzheimer's Disease Education and Referral (ADEAR) Center [employer] on Aging [address] [place] [address] [phone] (toll-free) CashCowGambling.be  Alzheimer's Association [address] [place], [place] [address] [phone] (toll-free, 24-hour helpline) [phone] (TDD) LimitLaws.hu  Alzheimer's Foundation of [place] [address] [place], [place] [address] [phone] (toll-free) www.alzfdn.org  Alzheimer's Drug Discovery Foundation [address], [address] [place], [place] [address] [phone] www.alzdiscovery.org  Association for Frontotemporal Degeneration [employer] [address] [place], [place] [address] [phone] (toll-free) www.theaftd.[facility]  [facility] [address] [place], [place] [address] [phone]  (toll-free) www.brightfocus.org/alzheimers  [facility] [address], [address] [place], [place] [address] [phone] www.BushWebsites.nl  Lewy Body Dementia Association [address], [place], [place] [address] [phone] (toll-free LBD Caregiver Link) www.lbda.[facility] [address] [place], [place] [address] [phone] (toll-free) [phone] [facility]) http://www.maynard.net/  National Organization for Rare Disorders [address] [place], [place] [address] 8-502-774-JOIN [phone]) (toll-free) www.rarediseases.org  The Dementias: Hope Through Research was jointly produced by the [employer] of Neurological Disorders and Stroke (NINDS) and the [employer] on Aging (NIA), both part of the [employer], the [employer] research agency--supporting scientific studies that turn discovery into health. NINDS is the nation's leading funder of research on the brain and nervous system. The NINDS mission is to reduce the burden of neurological disease. For more information and resources, visit ToledoAutomobile.co.uk [1] or call ([phone])278-8086. NIA leads the federal government effort conducting and supporting research on aging and the health and well-being of older people. NIA's Alzheimer's Disease Education and Referral (ADEAR) Center offers information and  publications on dementia and caregiving for families, caregivers, and professionals. For more information, visit CashCowGambling.be [2] or call [phone]. Also available from NIA are publications and information about Alzheimer's disease as well as the booklets Frontotemporal Disorders: Information for Patients, Families, and Caregivers and Lewy Body Dementia: Information for Patients, Families, and Professionals. Source URL:  VoiceZap.is

## 2023-08-02 MED ORDER — ALPRAZOLAM 0.25 MG PO TABS
0.2500 mg | ORAL_TABLET | Freq: Two times a day (BID) | ORAL | 3 refills | Status: DC
Start: 2023-08-02 — End: 2023-11-25

## 2023-08-02 MED ORDER — REXULTI 0.5 MG PO TABS
0.5000 mg | ORAL_TABLET | Freq: Every day | ORAL | 11 refills | Status: DC
Start: 2023-08-02 — End: 2023-11-23

## 2023-08-03 ENCOUNTER — Encounter: Payer: Self-pay | Admitting: *Deleted

## 2023-08-04 ENCOUNTER — Telehealth: Payer: Self-pay | Admitting: *Deleted

## 2023-08-04 NOTE — Telephone Encounter (Signed)
Prescriptions for Xanax and Rexulti have been faxed to [facility]. Received a receipt of confirmation.

## 2023-08-04 NOTE — Telephone Encounter (Signed)
I called and LMVM for [facility] to call back relating to who NP/MD who covers pt in AL facility.

## 2023-08-05 NOTE — Telephone Encounter (Signed)
Chart shows Dr Lucia Gaskins had already sent note directly to Ms Thigpen NP at [facility]. I also called and received fax number there to send note to: [phone]. Note faxed there. Received a receipt of confirmation.

## 2023-08-06 ENCOUNTER — Telehealth: Payer: Self-pay

## 2023-08-06 NOTE — Telephone Encounter (Signed)
Contacted pt daughter per DPR, informed her The alzheimer's protein in the blood came back positive, this suggests a high probability that she has alzheimer's. Hard to say at this stage but the blood work is consistent with alzheimer's pathology. The genetic testing has not come back but Dr Lucia Gaskins will send that through [facility].  Daughter verbally understood and was appreciative.  Advised to call the office back with any questions or concerns she had none at this time.

## 2023-08-06 NOTE — Telephone Encounter (Signed)
-----   Message from Anson Fret sent at 08/04/2023 12:41 PM EDT ----- Please let daughter know: The alzheimer's protein in the blood came back positive, this suggests a high probability that she has alzheimer's. Hard to say at this stage but the blood work is consistent with alzheimer's pathology. The genetic testing has not come back but I will send that through mychart if she can see her mother's information  thanks

## 2023-08-07 LAB — ATN PROFILE
A -- Beta-amyloid 42/40 Ratio: 0.094 — ABNORMAL LOW (ref >0.102)
Beta-amyloid 40: 303.21 pg/mL
Beta-amyloid 42: 28.43 pg/mL
N -- NfL, Plasma: 8.85 pg/mL (ref 0.00–11.55)
T -- p-tau181: 3 pg/mL — ABNORMAL HIGH (ref 0.00–0.97)

## 2023-08-07 LAB — APOE ALZHEIMER'S RISK

## 2023-08-13 ENCOUNTER — Telehealth: Payer: Self-pay | Admitting: *Deleted

## 2023-08-13 NOTE — Telephone Encounter (Signed)
Received a fax from [facility] stating PA is needed for Rexulti. Please do asap, thank you! Pt needs this for behavioral symptoms r/t Dementia.

## 2023-08-14 ENCOUNTER — Other Ambulatory Visit (HOSPITAL_COMMUNITY): Payer: Self-pay

## 2023-08-14 NOTE — Telephone Encounter (Signed)
     PA went thru [employer]-No pa needed already an active PA on file.

## 2023-08-17 NOTE — Telephone Encounter (Signed)
Noted  

## 2023-09-14 DIAGNOSIS — H04123 Dry eye syndrome of bilateral lacrimal glands: Secondary | ICD-10-CM | POA: Insufficient documentation

## 2023-09-14 DIAGNOSIS — Z961 Presence of intraocular lens: Secondary | ICD-10-CM | POA: Insufficient documentation

## 2023-09-21 ENCOUNTER — Encounter: Payer: Self-pay | Admitting: Neurology

## 2023-11-23 ENCOUNTER — Telehealth: Payer: Self-pay | Admitting: Neurology

## 2023-11-23 ENCOUNTER — Other Ambulatory Visit: Payer: Self-pay | Admitting: Neurology

## 2023-11-23 DIAGNOSIS — G301 Alzheimer's disease with late onset: Secondary | ICD-10-CM

## 2023-11-23 DIAGNOSIS — F0392 Unspecified dementia, unspecified severity, with psychotic disturbance: Secondary | ICD-10-CM

## 2023-11-23 MED ORDER — BREXPIPRAZOLE 1 MG PO TABS
1.0000 mg | ORAL_TABLET | Freq: Every day | ORAL | 6 refills | Status: DC
Start: 2023-11-23 — End: 2024-01-10

## 2023-11-23 NOTE — Telephone Encounter (Signed)
 Let daughter know I sent in a prescription for Rexulti  1mg  (increased from 0.5mg ). we can further increase it weekly if she does not respond let daughter know we will talk Wednesday. Who is the palliative nurse? I would love to speak with her as well

## 2023-11-23 NOTE — Telephone Encounter (Signed)
 I called and spoke to the NP for Palliative care. She knows that Dr. Tresia Fruit has an appointment VV with daughter in Wednesday.   She came to see pt today and pt was in tears,  paranoia, sundowning. She knows that she wanted to increase the zoloft  to 100mg  eventually first, but thought would ask if would consider this or rexulti .  Taking 50mg  po zoloft  now and rexulti  0.5mg  daily

## 2023-11-23 NOTE — Telephone Encounter (Signed)
 [facility]/ Carolynne Citron want to report pt's daughter is requesting neurologist to consider increasing Brexpiprazole  (REXULTI ) 0.5 MG TABS . Pt is having continued paranoia, anxiety and  sundowning due to dementia.
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
## 2023-11-24 NOTE — Telephone Encounter (Signed)
I called Ladona Ridgel NP back and let her know that Dr Lucia Gaskins had increased the Rexulti to 1 mg daily. Can potentially increase further even weekly if needed. At this time the Sertraline has not been increased. Looks like Dr Lucia Gaskins had messaged the daughter back in January but did not get response. She thanked me for the update. I talked to the daughter Nicholos Johns (on [place]) and let her know the plan as well. She did confirm patient is on Sertraline 50 mg daily Ladona Ridgel NP is aware). She also said the pt gets Xanax 0.25 mg TID (8 am, 12 pm, 8 pm) but it only lasts 3 hours and then the patient will call the daughter multiple times in the evenings. She said the pt's primary NP is Emily Filbert with [facility]. States she could get records if needed. She will talk more with Dr Lucia Gaskins at appt tomorrow.    Primary care NP at facility: Emily Filbert w/ [facility] NP: Ladona Ridgel w/ [facility]. Direct line: [phone]

## 2023-11-25 ENCOUNTER — Telehealth: Payer: Self-pay | Admitting: *Deleted

## 2023-11-25 ENCOUNTER — Telehealth: Payer: Self-pay | Admitting: Neurology

## 2023-11-25 ENCOUNTER — Telehealth (INDEPENDENT_AMBULATORY_CARE_PROVIDER_SITE_OTHER): Payer: Medicare Other | Admitting: Neurology

## 2023-11-25 DIAGNOSIS — F03918 Unspecified dementia, unspecified severity, with other behavioral disturbance: Secondary | ICD-10-CM | POA: Insufficient documentation

## 2023-11-25 DIAGNOSIS — F02818 Dementia in other diseases classified elsewhere, unspecified severity, with other behavioral disturbance: Secondary | ICD-10-CM

## 2023-11-25 DIAGNOSIS — F060 Psychotic disorder with hallucinations due to known physiological condition: Secondary | ICD-10-CM

## 2023-11-25 DIAGNOSIS — F03C0 Unspecified dementia, severe, without behavioral disturbance, psychotic disturbance, mood disturbance, and anxiety: Secondary | ICD-10-CM | POA: Insufficient documentation

## 2023-11-25 DIAGNOSIS — F02C11 Dementia in other diseases classified elsewhere, severe, with agitation: Secondary | ICD-10-CM

## 2023-11-25 DIAGNOSIS — G301 Alzheimer's disease with late onset: Secondary | ICD-10-CM | POA: Diagnosis not present

## 2023-11-25 DIAGNOSIS — F03911 Unspecified dementia, unspecified severity, with agitation: Secondary | ICD-10-CM

## 2023-11-25 MED ORDER — CLONAZEPAM 0.5 MG PO TABS: 0.5000 mg | ORAL_TABLET | Freq: Three times a day (TID) | ORAL | 3 refills | Status: DC

## 2023-11-25 MED ORDER — ALPRAZOLAM 0.25 MG PO TABS
0.2500 mg | ORAL_TABLET | Freq: Three times a day (TID) | ORAL | 5 refills | Status: DC | PRN
Start: 2023-11-25 — End: 2023-12-25
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
## 2023-11-25 NOTE — Telephone Encounter (Signed)
Called pt and scheduled VV for 3/26 @ 3:30 pm.

## 2023-11-25 NOTE — Patient Instructions (Addendum)
-   Change Clonazepam to .0.5mg  three times a day and can further incease to 1mg  (8am, 2pm and 8pm) - Increased Rixulti to 1mg  at bedtime. Can increase 2mg  in one week and then 3mg  another week and then 4mg  in another week but as we increase if no improvement would switch to risperdal - Can increase her zoloft to 100mg  but hate to do too many things at once this would  - IF they will do prn we can gixe xanax as needed for severe agitation inbetween the longer-acting clonazepam - Talk to [facility] about comfort measures (DNI, DNR, also discuss with Hospice preventitive measures not to get taken to the ED unless absolutely needed) - 6 weeks

## 2023-11-25 NOTE — Telephone Encounter (Signed)
Tried to call no answer. I left a message and texted him  Patient is extremely agitated, delusional, having hallucinations dangerous behavior.  The Xanax helps but does not last long enough.  I am going to change her to clonazepam 3 times a day and see if they would hold onto the Xanax as needed in between the clonazepam if she has severe agitation.  Family is aware of the risks but at this time the benefits outweigh the risk, patient is agitated, crying, hallucinating, they are aware that this medication can cause drowsiness falls, morbidity and mortality but at this time they are more interested in quality of life it has been very difficult for them.

## 2023-11-25 NOTE — Telephone Encounter (Signed)
Can we squeeze in an appointment in 6 weeks video at the end of the day with me anywhere?

## 2023-11-25 NOTE — Telephone Encounter (Signed)
Received a call from [facility]. He was asking to clarify the plan regarding pt's prescriptions for Xanax and Clonazepam. I advised that for now Dr Lucia Gaskins has canceled the Xanax prescription and replaced it with Clonazepam 0.5 mg TID. Dr Trevor Mace note mentions possibly adding back on the Xanax as PRN if this will be allowed but she has not placed the order. I told him I would clarify this with Dr Lucia Gaskins and then call him back. His number is [phone].

## 2023-11-26 ENCOUNTER — Telehealth: Payer: Self-pay | Admitting: *Deleted

## 2023-11-26 NOTE — Telephone Encounter (Signed)
-----   Message from Anson Fret sent at 11/25/2023  5:08 PM EST ----- Can you fax this note to Rutherford Limerick at [phone] please she is patient's palliative care nurse.

## 2023-11-26 NOTE — Telephone Encounter (Signed)
Faxed per Dr. Trevor Mace request.  10 pgs. (Last pt OV note). To Rutherford Limerick nurse in palliative care.  Confirmation received by fax.

## 2023-11-28 ENCOUNTER — Telehealth: Payer: Self-pay | Admitting: Neurology

## 2023-11-28 NOTE — Telephone Encounter (Signed)
 I spoke to the nurse Evelene Croon Love) working with her.  She is overly sedated and has had 2 falls since being placed on Klonopin 0.5 mg 3 times a day.  I changed the dose to 0.25 mg in the morning, 0.25 mg in the afternoon and 0.5 mg at night.  They are advised to call back in a few days if she is still overly sedated

## 2023-11-30 NOTE — Telephone Encounter (Signed)
 See other note

## 2023-11-30 NOTE — Telephone Encounter (Signed)
 Spoke to [employer] NP we decreased clonazepan to 0.25 bid and 0.5 at night over the weekend for sedation  Please call facility and hold clonazepam today and we will call in the morning and see if she is less sedated.   [place] in [employer]

## 2023-11-30 NOTE — Telephone Encounter (Signed)
 I called and spoke to Lurena Joiner, Charity fundraiser at [place].  Clonazepam is on hold for today.  She stated that they need orders for the medications that pt is taking,  I relayed that I have office note from the [facility] 11-25-2023 and can fax to her she said would need to be signed.  Fax # [phone].  I then called Olegario Messier daughter of pt.  She said pt is off all vitamins/ minerals.  She taking rexulti, zoloft, alprazolam prn, clonazepam held for today, then will see how she is.  (She relayed that the pharmacy takes 24 hr to process and so was asking about dose of the clonazepam,  I relayed wanted to see how she was.  (Dr. Lucia Gaskins spoke to [facility], [facility] (go between to assist).  Pt is back to calling her daughter every 10 min, but her motor skills not sure.  She had 2 falls, and caregivers have been caring her to bathroom.  I told her would keep her updated. Appreciated call back.

## 2023-11-30 NOTE — Telephone Encounter (Signed)
 Pt daughter is asking for a call from Dr Lucia Gaskins or RN to discuss her concern about the  clonazePAM (KLONOPIN) 0.5 MG tablet being too strong, she states the dose has pt depressed and sad, please call daughter to discuss.

## 2023-12-01 ENCOUNTER — Other Ambulatory Visit: Payer: Self-pay | Admitting: Neurology

## 2023-12-01 ENCOUNTER — Telehealth: Payer: Self-pay | Admitting: Neurology

## 2023-12-01 MED ORDER — CLONAZEPAM 0.125 MG PO TBDP: 0.1250 mg | ORAL_TABLET | Freq: Three times a day (TID) | ORAL | 4 refills | Status: DC

## 2023-12-01 NOTE — Telephone Encounter (Signed)
 Pt's daughter, Carole Civil the facility took her off clonazePAM (KLONOPIN) 0.5 MG tablet. She recovered completely and was able to move around and all the things that she had been experienceing: crying, confusion, depression,  talking about things that are not there, she thinks she has more than one cat. Request send an order to assistied living facility, [facility] to [facility] to start Xanux 3 times a day or a muich lower dose of Clonazepam. Please send prescription to [facility].  Going to be out of the country for the next 6 days,but will able to be reached by telephone.

## 2023-12-01 NOTE — Telephone Encounter (Signed)
 I spoke to family and pharmacy will decrease clonazepam to .125mg  tid and see how she does thanks

## 2023-12-02 ENCOUNTER — Telehealth: Payer: Self-pay | Admitting: Neurology

## 2023-12-02 ENCOUNTER — Other Ambulatory Visit: Payer: Self-pay | Admitting: Neurology

## 2023-12-02 DIAGNOSIS — G301 Alzheimer's disease with late onset: Secondary | ICD-10-CM

## 2023-12-02 MED ORDER — SERTRALINE HCL 100 MG PO TABS
100.0000 mg | ORAL_TABLET | Freq: Every day | ORAL | 4 refills | Status: AC
Start: 2023-12-02 — End: ?
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
## 2023-12-03 NOTE — Telephone Encounter (Signed)
 Left another message for ms kline. Left her my cell phone to text me to review patient's meds.

## 2023-12-03 NOTE — Telephone Encounter (Signed)
 Call patient's daughter. I tried to call her yesterday but no answer. I spoke to [place] and [employer] her pcp and we are all on the same page as far as medication management and what we will try. I went ahead and increased patient's zoloft. I am not sure the rexulti is working(matt and Shanda Bumps have also not had good results either with this medication but we chose it because it was just fda approved for behavioral problems in dementia but it is appearing not to be very effective in many patients). so we have decided to add something called depakote which may be better than rexulti. When I spoke to the terabella yesterday they said patient was calm. Both jessica and matt hunt (pcp) are on board with my plan. We will also continue the low dose clonazepam to try and keep her calm. I tried calling [facility] to speak with the nurse manager left a message I will call back again today. Please relay message to patient's daughter thank you

## 2023-12-04 NOTE — Telephone Encounter (Signed)
 I tried to call the daughter. Her VM picked up but its full so I was unable to leave a message.

## 2023-12-07 ENCOUNTER — Other Ambulatory Visit: Payer: Self-pay | Admitting: Neurology

## 2023-12-07 DIAGNOSIS — F03911 Unspecified dementia, unspecified severity, with agitation: Secondary | ICD-10-CM

## 2023-12-07 MED ORDER — DIVALPROEX SODIUM 125 MG PO CSDR
125.0000 mg | DELAYED_RELEASE_CAPSULE | Freq: Two times a day (BID) | ORAL | 3 refills | Status: AC
Start: 2023-12-07 — End: ?

## 2023-12-07 NOTE — Telephone Encounter (Signed)
 I called daughter, Olegario Messier,  they are OOT, her daughters(pt nieces)  were the go between while they were away.  Pt has been more agitated (as daughter OOT), she is bored, unhappy.  She is worse after 1700.  Does not feel rexulti working (feels that worse with her 1mg  dose).  More hallucinations, (seeing more cats, her husband who is deceased).   I relayed that Dr. Lucia Gaskins did speak to Jessica/ Eustace Quail pcp and all on board with med management and what we will try (increased zoloft to 100mg  po daily), rexulti not working, change to depakote (which I donot see as ordered on our medlist. This is seizure medication but is used also for behavior/anxiety/depression.  When Dr. Lucia Gaskins spoke to [facility] 12/02/2023 she was calm.  Will continue low dose clonazepam .0125mg  po tid. They will be back to day.  Husband to go see pt when returned as she will be back at work.  She appreciated call back and care from [place].

## 2023-12-07 NOTE — Telephone Encounter (Signed)
 Let her know I added depakote today twice a day. In a week we can increase it. Please have family send [place] a message after she has been on the medicine for about a week

## 2023-12-09 NOTE — Telephone Encounter (Signed)
 I spoke with the patient's daughter and let her know the Depakote 125 mg twice daily has been added to patient's medication regimen.  I asked the daughter to reach out to [place] about a week after she starts the medication to let us know how she is doing because we may be able to increase the dose.  The daughter verbalized understanding and appreciation. She mentioned that clonazepam oversedated the patient. Patient's facility has had illness running through and they have isolated the residents to their rooms for now. Nicholos Johns did also ask if the patient is to continue Rexulti for now.  I told her there was no mention of stopping it at the moment and the medication is still on her list.  Dr. Lucia Gaskins may be overlapping this with the Depakote for a time but that I would clarify. Pt's daughter, Nicholos Johns, was very Adult nurse.

## 2023-12-09 NOTE — Telephone Encounter (Signed)
 I called pt's daughter back and let her know the plan per Dr Lucia Gaskins regarding Depakote and Rexulti. She verbalized understanding and appreciation. She will contact the facility to see when Depakote starts. She will contact us a week after starting to let us know how she is doing for further increase.

## 2023-12-09 NOTE — Telephone Encounter (Signed)
 I'll be overlapping and if we can get her to 250mg  twice a day and she is doing well we can decr\ease the rexulti have them give [place] an update in about a week if we should increase the depakote thanks

## 2023-12-21 ENCOUNTER — Inpatient Hospital Stay (HOSPITAL_COMMUNITY)
Admission: EM | Admit: 2023-12-21 | Discharge: 2023-12-25 | DRG: 689 | Disposition: A | Discharge status: Skilled Nursing Facility | Source: Skilled Nursing Facility | Attending: Internal Medicine | Admitting: Internal Medicine

## 2023-12-21 ENCOUNTER — Emergency Department (HOSPITAL_COMMUNITY)

## 2023-12-21 ENCOUNTER — Other Ambulatory Visit: Payer: Self-pay

## 2023-12-21 DIAGNOSIS — Z87891 Personal history of nicotine dependence: Secondary | ICD-10-CM

## 2023-12-21 DIAGNOSIS — N3000 Acute cystitis without hematuria: Principal | ICD-10-CM | POA: Diagnosis present

## 2023-12-21 DIAGNOSIS — Z993 Dependence on wheelchair: Secondary | ICD-10-CM

## 2023-12-21 DIAGNOSIS — F02818 Dementia in other diseases classified elsewhere, unspecified severity, with other behavioral disturbance: Secondary | ICD-10-CM

## 2023-12-21 DIAGNOSIS — F060 Psychotic disorder with hallucinations due to known physiological condition: Secondary | ICD-10-CM

## 2023-12-21 DIAGNOSIS — Z885 Allergy status to narcotic agent status: Secondary | ICD-10-CM

## 2023-12-21 DIAGNOSIS — R410 Disorientation, unspecified: Secondary | ICD-10-CM | POA: Diagnosis not present

## 2023-12-21 DIAGNOSIS — Z1152 Encounter for screening for COVID-19: Secondary | ICD-10-CM

## 2023-12-21 DIAGNOSIS — Z90710 Acquired absence of both cervix and uterus: Secondary | ICD-10-CM

## 2023-12-21 DIAGNOSIS — F32A Depression, unspecified: Secondary | ICD-10-CM | POA: Diagnosis present

## 2023-12-21 DIAGNOSIS — M810 Age-related osteoporosis without current pathological fracture: Secondary | ICD-10-CM | POA: Diagnosis present

## 2023-12-21 DIAGNOSIS — F03911 Unspecified dementia, unspecified severity, with agitation: Secondary | ICD-10-CM

## 2023-12-21 DIAGNOSIS — R338 Other retention of urine: Secondary | ICD-10-CM | POA: Insufficient documentation

## 2023-12-21 DIAGNOSIS — F03C0 Unspecified dementia, severe, without behavioral disturbance, psychotic disturbance, mood disturbance, and anxiety: Secondary | ICD-10-CM | POA: Diagnosis present

## 2023-12-21 DIAGNOSIS — E669 Obesity, unspecified: Secondary | ICD-10-CM | POA: Diagnosis present

## 2023-12-21 DIAGNOSIS — G309 Alzheimer's disease, unspecified: Secondary | ICD-10-CM | POA: Diagnosis present

## 2023-12-21 DIAGNOSIS — Z853 Personal history of malignant neoplasm of breast: Secondary | ICD-10-CM

## 2023-12-21 DIAGNOSIS — Z882 Allergy status to sulfonamides status: Secondary | ICD-10-CM

## 2023-12-21 DIAGNOSIS — Z888 Allergy status to other drugs, medicaments and biological substances status: Secondary | ICD-10-CM

## 2023-12-21 DIAGNOSIS — R823 Hemoglobinuria: Secondary | ICD-10-CM | POA: Diagnosis present

## 2023-12-21 DIAGNOSIS — N390 Urinary tract infection, site not specified: Secondary | ICD-10-CM | POA: Diagnosis present

## 2023-12-21 DIAGNOSIS — M19071 Primary osteoarthritis, right ankle and foot: Secondary | ICD-10-CM | POA: Diagnosis present

## 2023-12-21 DIAGNOSIS — Z8249 Family history of ischemic heart disease and other diseases of the circulatory system: Secondary | ICD-10-CM

## 2023-12-21 DIAGNOSIS — I2510 Atherosclerotic heart disease of native coronary artery without angina pectoris: Secondary | ICD-10-CM | POA: Diagnosis present

## 2023-12-21 DIAGNOSIS — E785 Hyperlipidemia, unspecified: Secondary | ICD-10-CM | POA: Diagnosis present

## 2023-12-21 DIAGNOSIS — Z79899 Other long term (current) drug therapy: Secondary | ICD-10-CM

## 2023-12-21 DIAGNOSIS — G9341 Metabolic encephalopathy: Secondary | ICD-10-CM | POA: Diagnosis present

## 2023-12-21 DIAGNOSIS — M1711 Unilateral primary osteoarthritis, right knee: Secondary | ICD-10-CM | POA: Diagnosis present

## 2023-12-21 DIAGNOSIS — D631 Anemia in chronic kidney disease: Secondary | ICD-10-CM | POA: Diagnosis present

## 2023-12-21 DIAGNOSIS — F0283 Dementia in other diseases classified elsewhere, unspecified severity, with mood disturbance: Secondary | ICD-10-CM | POA: Diagnosis present

## 2023-12-21 DIAGNOSIS — Z881 Allergy status to other antibiotic agents status: Secondary | ICD-10-CM

## 2023-12-21 DIAGNOSIS — K219 Gastro-esophageal reflux disease without esophagitis: Secondary | ICD-10-CM | POA: Diagnosis present

## 2023-12-21 DIAGNOSIS — G629 Polyneuropathy, unspecified: Secondary | ICD-10-CM | POA: Diagnosis present

## 2023-12-21 MED ORDER — SODIUM CHLORIDE 0.9 % IV BOLUS
500.0000 mL | Freq: Once | INTRAVENOUS | Status: AC
  Administered 2023-12-22: 500 mL via INTRAVENOUS

## 2023-12-21 NOTE — ED Triage Notes (Signed)
 Pt BIBA from [facility] [place] with c/o AMS, possible UTI per family. Mild confusion at baseline. New onset of alzheimer's. Weaker past few days, needing more assistance. Pt refuses to eat or drink. Pt reports back pain.

## 2023-12-22 DIAGNOSIS — K219 Gastro-esophageal reflux disease without esophagitis: Secondary | ICD-10-CM | POA: Diagnosis present

## 2023-12-22 DIAGNOSIS — Z885 Allergy status to narcotic agent status: Secondary | ICD-10-CM | POA: Diagnosis not present

## 2023-12-22 DIAGNOSIS — F32A Depression, unspecified: Secondary | ICD-10-CM | POA: Diagnosis present

## 2023-12-22 DIAGNOSIS — Z882 Allergy status to sulfonamides status: Secondary | ICD-10-CM | POA: Diagnosis not present

## 2023-12-22 DIAGNOSIS — N3000 Acute cystitis without hematuria: Secondary | ICD-10-CM | POA: Diagnosis present

## 2023-12-22 DIAGNOSIS — Z993 Dependence on wheelchair: Secondary | ICD-10-CM | POA: Diagnosis not present

## 2023-12-22 DIAGNOSIS — G629 Polyneuropathy, unspecified: Secondary | ICD-10-CM | POA: Diagnosis present

## 2023-12-22 DIAGNOSIS — M1711 Unilateral primary osteoarthritis, right knee: Secondary | ICD-10-CM | POA: Diagnosis present

## 2023-12-22 DIAGNOSIS — R410 Disorientation, unspecified: Secondary | ICD-10-CM | POA: Diagnosis present

## 2023-12-22 DIAGNOSIS — Z1152 Encounter for screening for COVID-19: Secondary | ICD-10-CM | POA: Diagnosis not present

## 2023-12-22 DIAGNOSIS — G9341 Metabolic encephalopathy: Secondary | ICD-10-CM | POA: Diagnosis present

## 2023-12-22 DIAGNOSIS — M19071 Primary osteoarthritis, right ankle and foot: Secondary | ICD-10-CM | POA: Diagnosis present

## 2023-12-22 DIAGNOSIS — Z90710 Acquired absence of both cervix and uterus: Secondary | ICD-10-CM | POA: Diagnosis not present

## 2023-12-22 DIAGNOSIS — I2510 Atherosclerotic heart disease of native coronary artery without angina pectoris: Secondary | ICD-10-CM | POA: Diagnosis present

## 2023-12-22 DIAGNOSIS — Z79899 Other long term (current) drug therapy: Secondary | ICD-10-CM | POA: Diagnosis not present

## 2023-12-22 DIAGNOSIS — E669 Obesity, unspecified: Secondary | ICD-10-CM | POA: Diagnosis present

## 2023-12-22 DIAGNOSIS — E785 Hyperlipidemia, unspecified: Secondary | ICD-10-CM | POA: Diagnosis present

## 2023-12-22 DIAGNOSIS — Z87891 Personal history of nicotine dependence: Secondary | ICD-10-CM | POA: Diagnosis not present

## 2023-12-22 DIAGNOSIS — F0283 Dementia in other diseases classified elsewhere, unspecified severity, with mood disturbance: Secondary | ICD-10-CM | POA: Diagnosis present

## 2023-12-22 DIAGNOSIS — N390 Urinary tract infection, site not specified: Secondary | ICD-10-CM | POA: Diagnosis present

## 2023-12-22 DIAGNOSIS — D631 Anemia in chronic kidney disease: Secondary | ICD-10-CM | POA: Diagnosis present

## 2023-12-22 DIAGNOSIS — Z881 Allergy status to other antibiotic agents status: Secondary | ICD-10-CM | POA: Diagnosis not present

## 2023-12-22 DIAGNOSIS — Z888 Allergy status to other drugs, medicaments and biological substances status: Secondary | ICD-10-CM | POA: Diagnosis not present

## 2023-12-22 DIAGNOSIS — G309 Alzheimer's disease, unspecified: Secondary | ICD-10-CM | POA: Diagnosis present

## 2023-12-22 DIAGNOSIS — R823 Hemoglobinuria: Secondary | ICD-10-CM | POA: Diagnosis present

## 2023-12-22 DIAGNOSIS — Z8249 Family history of ischemic heart disease and other diseases of the circulatory system: Secondary | ICD-10-CM | POA: Diagnosis not present

## 2023-12-22 LAB — CBC WITH DIFFERENTIAL/PLATELET
Abs Immature Granulocytes: 0 10*3/uL (ref 0.00–0.07)
Abs Immature Granulocytes: 0.75 10*3/uL — ABNORMAL HIGH (ref 0.00–0.07)
Basophils Absolute: 0 10*3/uL (ref 0.0–0.1)
Basophils Absolute: 0 10*3/uL (ref 0.0–0.1)
Basophils Relative: 0 %
Basophils Relative: 0 %
Eosinophils Absolute: 0 10*3/uL (ref 0.0–0.5)
Eosinophils Absolute: 0.1 10*3/uL (ref 0.0–0.5)
Eosinophils Relative: 1 %
Eosinophils Relative: 1 %
HCT: 28.1 % — ABNORMAL LOW (ref 36.0–46.0)
HCT: 29.4 % — ABNORMAL LOW (ref 36.0–46.0)
Hemoglobin: 9.7 g/dL — ABNORMAL LOW (ref 12.0–15.0)
Hemoglobin: 10.2 g/dL — ABNORMAL LOW (ref 12.0–15.0)
Immature Granulocytes: 0 %
Immature Granulocytes: 10 %
Lymphocytes Relative: 6 %
Lymphocytes Relative: 7 %
Lymphs Abs: 0.4 10*3/uL — ABNORMAL LOW (ref 0.7–4.0)
Lymphs Abs: 0.6 10*3/uL — ABNORMAL LOW (ref 0.7–4.0)
MCH: 28.5 pg (ref 26.0–34.0)
MCH: 28.3 pg (ref 26.0–34.0)
MCHC: 34.5 g/dL (ref 30.0–36.0)
MCHC: 34.7 g/dL (ref 30.0–36.0)
MCV: 82.6 fL (ref 80.0–100.0)
MCV: 81.7 fL (ref 80.0–100.0)
Monocytes Absolute: 0.2 10*3/uL (ref 0.1–1.0)
Monocytes Absolute: 0.5 10*3/uL (ref 0.1–1.0)
Monocytes Relative: 3 %
Monocytes Relative: 7 %
Neutro Abs: 6.4 10*3/uL (ref 1.7–7.7)
Neutro Abs: 5.6 10*3/uL (ref 1.7–7.7)
Neutrophils Relative %: 90 %
Neutrophils Relative %: 75 %
Platelets: 96 10*3/uL — ABNORMAL LOW (ref 150–400)
Platelets: 85 10*3/uL — ABNORMAL LOW (ref 150–400)
RBC: 3.4 MIL/uL — ABNORMAL LOW (ref 3.87–5.11)
RBC: 3.6 MIL/uL — ABNORMAL LOW (ref 3.87–5.11)
RDW: 15.2 % (ref 11.5–15.5)
RDW: 15 % (ref 11.5–15.5)
WBC: 7.1 10*3/uL (ref 4.0–10.5)
WBC: 7.6 10*3/uL (ref 4.0–10.5)
nRBC: 0 % (ref 0.0–0.2)
nRBC: 0 % (ref 0.0–0.2)

## 2023-12-22 LAB — URINALYSIS, W/ REFLEX TO CULTURE (INFECTION SUSPECTED)
Bilirubin Urine: NEGATIVE
Glucose, UA: NEGATIVE mg/dL
Ketones, ur: NEGATIVE mg/dL
Nitrite: NEGATIVE
Protein, ur: 30 mg/dL — AB
Specific Gravity, Urine: 1.03 (ref 1.005–1.030)
pH: 5 (ref 5.0–8.0)

## 2023-12-22 LAB — COMPREHENSIVE METABOLIC PANEL
ALT: 23 U/L (ref 0–44)
AST: 32 U/L (ref 15–41)
Albumin: 3.7 g/dL (ref 3.5–5.0)
Alkaline Phosphatase: 52 U/L (ref 38–126)
Anion gap: 11 (ref 5–15)
BUN: 36 mg/dL — ABNORMAL HIGH (ref 8–23)
CO2: 22 mmol/L (ref 22–32)
Calcium: 8.6 mg/dL — ABNORMAL LOW (ref 8.9–10.3)
Chloride: 101 mmol/L (ref 98–111)
Creatinine, Ser: 1.08 mg/dL — ABNORMAL HIGH (ref 0.44–1.00)
GFR, Estimated: 50 mL/min — ABNORMAL LOW (ref >60)
Glucose, Bld: 113 mg/dL — ABNORMAL HIGH (ref 70–99)
Potassium: 3.1 mmol/L — ABNORMAL LOW (ref 3.5–5.1)
Sodium: 134 mmol/L — ABNORMAL LOW (ref 135–145)
Total Bilirubin: 1.4 mg/dL — ABNORMAL HIGH (ref 0.0–1.2)
Total Protein: 7 g/dL (ref 6.5–8.1)

## 2023-12-22 LAB — LIPASE, BLOOD: Lipase: 32 U/L (ref 11–51)

## 2023-12-22 LAB — BASIC METABOLIC PANEL
Anion gap: 10 (ref 5–15)
BUN: 31 mg/dL — ABNORMAL HIGH (ref 8–23)
CO2: 22 mmol/L (ref 22–32)
Calcium: 8.5 mg/dL — ABNORMAL LOW (ref 8.9–10.3)
Chloride: 103 mmol/L (ref 98–111)
Creatinine, Ser: 0.72 mg/dL (ref 0.44–1.00)
GFR, Estimated: >60 mL/min (ref >60)
Glucose, Bld: 85 mg/dL (ref 70–99)
Potassium: 3.7 mmol/L (ref 3.5–5.1)
Sodium: 135 mmol/L (ref 135–145)

## 2023-12-22 LAB — RESP PANEL BY RT-PCR (RSV, FLU A&B, COVID)  RVPGX2
Influenza A by PCR: NEGATIVE
Influenza B by PCR: NEGATIVE
Resp Syncytial Virus by PCR: NEGATIVE
SARS Coronavirus 2 by RT PCR: NEGATIVE

## 2023-12-22 LAB — MAGNESIUM: Magnesium: 1.9 mg/dL (ref 1.7–2.4)

## 2023-12-22 MED ORDER — ONDANSETRON HCL 4 MG/2ML IJ SOLN: 4.0000 mg | Freq: Four times a day (QID) | INTRAMUSCULAR | Status: DC | PRN

## 2023-12-22 MED ORDER — MELATONIN 3 MG PO TABS
3.0000 mg | ORAL_TABLET | Freq: Every evening | ORAL | Status: DC | PRN
  Administered 2023-12-23 – 2023-12-24 (×2): 3 mg via ORAL
  Filled 2023-12-22 (×2): qty 1

## 2023-12-22 MED ORDER — SODIUM CHLORIDE 0.9 % IV SOLN
1.0000 g | Freq: Once | INTRAVENOUS | Status: AC
  Administered 2023-12-22: 1 g via INTRAVENOUS
  Filled 2023-12-22: qty 10

## 2023-12-22 MED ORDER — METHOCARBAMOL 500 MG PO TABS
500.0000 mg | ORAL_TABLET | Freq: Three times a day (TID) | ORAL | Status: DC | PRN
  Administered 2023-12-23 – 2023-12-25 (×4): 500 mg via ORAL
  Filled 2023-12-22 (×4): qty 1

## 2023-12-22 MED ORDER — DIVALPROEX SODIUM 125 MG PO CSDR
125.0000 mg | DELAYED_RELEASE_CAPSULE | Freq: Two times a day (BID) | ORAL | Status: DC
Start: 2023-12-22 — End: 2023-12-26
  Administered 2023-12-22 – 2023-12-25 (×8): 125 mg via ORAL
  Filled 2023-12-22 (×8): qty 1

## 2023-12-22 MED ORDER — ACETAMINOPHEN 650 MG RE SUPP: 650.0000 mg | Freq: Four times a day (QID) | RECTAL | Status: DC | PRN

## 2023-12-22 MED ORDER — SODIUM CHLORIDE 0.9 % IV SOLN
1.0000 g | INTRAVENOUS | Status: DC
  Administered 2023-12-23 – 2023-12-24 (×2): 1 g via INTRAVENOUS
  Filled 2023-12-22 (×3): qty 10

## 2023-12-22 MED ORDER — SERTRALINE HCL 100 MG PO TABS
100.0000 mg | ORAL_TABLET | Freq: Every day | ORAL | Status: DC
Start: 2023-12-22 — End: 2023-12-26
  Administered 2023-12-22 – 2023-12-25 (×4): 100 mg via ORAL
  Filled 2023-12-22 (×3): qty 1
  Filled 2023-12-22: qty 2

## 2023-12-22 MED ORDER — BREXPIPRAZOLE 1 MG PO TABS
1.0000 mg | ORAL_TABLET | Freq: Every day | ORAL | Status: DC
  Administered 2023-12-23 – 2023-12-25 (×3): 1 mg via ORAL
  Filled 2023-12-22 (×3): qty 1

## 2023-12-22 MED ORDER — ACETAMINOPHEN 325 MG PO TABS
650.0000 mg | ORAL_TABLET | Freq: Four times a day (QID) | ORAL | Status: DC | PRN
  Administered 2023-12-22 – 2023-12-23 (×2): 650 mg via ORAL
  Filled 2023-12-22 (×2): qty 2

## 2023-12-22 MED ORDER — KETOROLAC TROMETHAMINE 15 MG/ML IJ SOLN
15.0000 mg | Freq: Once | INTRAMUSCULAR | Status: AC
  Administered 2023-12-22: 15 mg via INTRAVENOUS
  Filled 2023-12-22: qty 1

## 2023-12-22 NOTE — Plan of Care (Signed)
  Problem: Coping: Goal: Level of anxiety will decrease Outcome: Progressing   Problem: Pain Managment: Goal: General experience of comfort will improve and/or be controlled Outcome: Progressing   Problem: Skin Integrity: Goal: Risk for impaired skin integrity will decrease Outcome: Progressing

## 2023-12-22 NOTE — ED Provider Notes (Signed)
 Emergency Department Provider Note   I have reviewed the triage vital signs and the nursing notes.   HISTORY  Chief Complaint Altered Mental Status   HPI Beverly Jacobs is a 89 y.o. female with past history of Alzheimer's, currently at an assisted living, presents to the emergency department with altered mental status.  Family at bedside report increased confusion and lower back pain over the past week.  Apparently the patient has been refusing to eat, drink, bathe.  She has been complaining of severe back pain and not getting into her wheelchair.  EMS report concern for possible UTI from the facility.  Family at bedside report that her current mental status is significantly worse than her baseline.  Level 5 caveat: Dementia and AMS.    Past Medical History:  Diagnosis Date   Cancer [facility])    breast   Coronary artery disease    GERD (gastroesophageal reflux disease)    Hyperlipidemia with target low density lipoprotein (LDL) cholesterol less than 100 mg/dL    Knee pain, left    Neuropathy     Review of Systems  Level 5 caveat: AMS  ____________________________________________   PHYSICAL EXAM:  VITAL SIGNS: ED Triage Vitals [12/21/23 2327]  Encounter Vitals Group     BP (!) 139/94     Pulse Rate 74     Resp (!) 25     Temp 98.2 F (36.8 C)     Temp Source Oral     SpO2 100 %   Constitutional: Alert but confused.  Eyes: Conjunctivae are normal.  Head: Atraumatic. Nose: No congestion/rhinnorhea. Mouth/Throat: Mucous membranes are slightly dry.  Neck: No stridor.   Cardiovascular: Normal rate, regular rhythm. Good peripheral circulation. Grossly normal heart sounds.   Respiratory: Normal respiratory effort.  No retractions. Lungs CTAB. Gastrointestinal: Soft and nontender. No distention.  Musculoskeletal: No lower extremity tenderness nor edema. No gross deformities of extremities. Normal ROM of the hips.  Neurologic:  Normal speech and language. Moving all  extremities equally but full neuro exam limited by confusion.  Skin:  Skin is warm, dry and intact. No rash noted.   ____________________________________________   LABS (all labs ordered are listed, but only abnormal results are displayed)  Labs Reviewed  COMPREHENSIVE METABOLIC PANEL - Abnormal; Notable for the following components:      Result Value   Sodium 134 (*)    Potassium 3.1 (*)    Glucose, Bld 113 (*)    BUN 36 (*)    Creatinine, Ser 1.08 (*)    Calcium 8.6 (*)    Total Bilirubin 1.4 (*)    GFR, Estimated 50 (*)    All other components within normal limits  CBC WITH DIFFERENTIAL/PLATELET - Abnormal; Notable for the following components:   RBC 3.40 (*)    Hemoglobin 9.7 (*)    HCT 28.1 (*)    Platelets 96 (*)    Lymphs Abs 0.4 (*)    All other components within normal limits  URINALYSIS, W/ REFLEX TO CULTURE (INFECTION SUSPECTED) - Abnormal; Notable for the following components:   APPearance HAZY (*)    Hgb urine dipstick SMALL (*)    Protein, ur 30 (*)    Leukocytes,Ua MODERATE (*)    Bacteria, UA MANY (*)    All other components within normal limits  RESP PANEL BY RT-PCR (RSV, FLU A&B, COVID)  RVPGX2  URINE CULTURE  LIPASE, BLOOD  BASIC METABOLIC PANEL  MAGNESIUM  CBC WITH DIFFERENTIAL/PLATELET   ____________________________________________  EKG   EKG Interpretation Date/Time:  Monday December 21 2023 23:28:12 EDT Ventricular Rate:  73 PR Interval:  172 QRS Duration:  106 QT Interval:  426 QTC Calculation: 470 R Axis:   61  Text Interpretation: Sinus rhythm RSR' in V1 or V2, probably normal variant Nonspecific T abnrm, anterolateral leads Confirmed by Alona Bene [phone]) on 12/22/2023 12:12:59 AM        ____________________________________________  RADIOLOGY  CT Renal Stone Study Result Date: 12/22/2023 CLINICAL DATA:  Abdominal/flank pain, stone suspected. Urinary tract infection EXAM: CT ABDOMEN AND PELVIS WITHOUT CONTRAST TECHNIQUE:  Multidetector CT imaging of the abdomen and pelvis was performed following the standard protocol without IV contrast. RADIATION DOSE REDUCTION: This exam was performed according to the departmental dose-optimization program which includes automated exposure control, adjustment of the mA and/or kV according to patient size and/or use of iterative reconstruction technique. COMPARISON:  None Available. FINDINGS: Lower chest: No acute abnormality. Mild cardiomegaly. Moderate hernia. Hepatobiliary: Cholelithiasis without superimposed pericholecystic inflammatory change. Liver unremarkable on this noncontrast examination. No intra or extrahepatic biliary ductal dilation. Pancreas: Unremarkable Spleen: Unremarkable Adrenals/Urinary Tract: Adrenal glands are unremarkable. The kidneys are normal in size and position. Multiple bilateral parapelvic cysts are better assessed prior MRI examination of 08/06/2015. No hydronephrosis. No intrarenal or ureteral calculi. The bladder is unremarkable. Stomach/Bowel: Stomach is within normal limits. Appendix absent. No evidence of bowel wall thickening, distention, or inflammatory changes. Vascular/Lymphatic: Aortic atherosclerosis. No enlarged abdominal or pelvic lymph nodes. Reproductive: Status post hysterectomy. No adnexal masses. Other: No abdominal wall hernia or abnormality. No abdominopelvic ascites. Musculoskeletal: Osseous structures are age-appropriate. No acute bone abnormality. IMPRESSION: 1. No acute intra-abdominal pathology identified. No definite radiographic explanation for the patient's reported symptoms. 2. Cholelithiasis. 3. Multiple other nonacute observations, as described above. Aortic Atherosclerosis (ICD10-I70.0). Electronically Signed   By: Helyn Numbers M.D.   On: 12/22/2023 02:22   DG Chest Portable 1 View Result Date: 12/22/2023 CLINICAL DATA:  Shortness of breath EXAM: PORTABLE CHEST 1 VIEW COMPARISON:  Chest x-ray 12/22/2014 FINDINGS: Heart is  enlarged. There central pulmonary vascular congestion. There is a small left pleural effusion patchy airspace opacity in the left costophrenic angle. No pneumothorax or acute fracture. Surgical clips overlie the upper left chest and axilla. IMPRESSION: 1. Cardiomegaly with central pulmonary vascular congestion. 2. Small left pleural effusion with patchy airspace opacity in the left costophrenic angle, likely atelectasis. Electronically Signed   By: Darliss Cheney M.D.   On: 12/22/2023 01:59   CT Head Wo Contrast Result Date: 12/22/2023 CLINICAL DATA:  Altered mental status EXAM: CT HEAD WITHOUT CONTRAST TECHNIQUE: Contiguous axial images were obtained from the base of the skull through the vertex without intravenous contrast. RADIATION DOSE REDUCTION: This exam was performed according to the departmental dose-optimization program which includes automated exposure control, adjustment of the mA and/or kV according to patient size and/or use of iterative reconstruction technique. COMPARISON:  12/23/2022 FINDINGS: Brain: No mass,hemorrhage or extra-axial collection. Normal appearance of the parenchyma and CSF spaces. Vascular: No hyperdense vessel or unexpected vascular calcification. Skull: The visualized skull base, calvarium and extracranial soft tissues are normal. Sinuses/Orbits: No fluid levels or advanced mucosal thickening of the visualized paranasal sinuses. No mastoid or middle ear effusion. Normal orbits. Other: None. IMPRESSION: Normal head CT. Electronically Signed   By: Deatra Robinson M.D.   On: 12/22/2023 00:58    ____________________________________________   PROCEDURES  Procedure(s) performed:   Procedures  None  ____________________________________________   INITIAL IMPRESSION /  ASSESSMENT AND PLAN / ED COURSE  Pertinent labs & imaging results that were available during my care of the patient were reviewed by me and considered in my medical decision making (see chart for details).    This patient is Presenting for Evaluation of AMS, which does require a range of treatment options, and is a complaint that involves a high risk of morbidity and mortality.  The Differential Diagnoses includes but is not exclusive to alcohol, illicit or prescription medications, intracranial pathology such as stroke, intracerebral hemorrhage, fever or infectious causes including sepsis, hypoxemia, uremia, trauma, endocrine related disorders such as diabetes, hypoglycemia, thyroid-related diseases, etc.   Critical Interventions-    Medications  cefTRIAXone (ROCEPHIN) 1 g in sodium chloride 0.9 % 100 mL IVPB (has no administration in time range)  acetaminophen (TYLENOL) tablet 650 mg (has no administration in time range)    Or  acetaminophen (TYLENOL) suppository 650 mg (has no administration in time range)  melatonin tablet 3 mg (has no administration in time range)  ondansetron (ZOFRAN) injection 4 mg (has no administration in time range)  cefTRIAXone (ROCEPHIN) 1 g in sodium chloride 0.9 % 100 mL IVPB (has no administration in time range)  sodium chloride 0.9 % bolus 500 mL (0 mLs Intravenous Stopped 12/22/23 0222)    Reassessment after intervention: mental status unchanged.    I did obtain Additional Historical Information from family at bedside.    Clinical Laboratory Tests Ordered, included CBC without leukocytosis.  Mild anemia to 9.7. UA consistent with UTI. Mild increase in creatinine at 1.08.   Radiologic Tests Ordered, included CT head, CT renal, and CXR. I independently interpreted the images and agree with radiology interpretation.   Cardiac Monitor Tracing which shows NSR.    Social Determinants of Health Risk patient is a non-smoker.   Consult complete with [facility]. Plan for admit.   Medical Decision Making: Summary:  Patient presents to the emergency department for evaluation of altered mental status and concern for possible UTI as the source.  Patient does not appear to  have any focal neurologic deficits.  Patient also complaining of back pain both to family and at the facility.  Plan for CT renal with UA, CT head and reassess.  Reevaluation with update and discussion with patient and family at bedside. Plan for abx and admit for AMS likely 2/2 UTI. They are in agreement.   Patient's presentation is most consistent with acute presentation with potential threat to life or bodily function.   Disposition: admit  ____________________________________________  FINAL CLINICAL IMPRESSION(S) / ED DIAGNOSES  Final diagnoses:  Disorientation  Acute cystitis without hematuria    Note:  This document was prepared using Dragon voice recognition software and may include unintentional dictation errors.  Alona Bene, MD, [facility]    Derick Seminara, Arlyss Repress, MD 12/22/23 [phone]

## 2023-12-22 NOTE — H&P (Signed)
 History and Physical    Patient: Beverly Jacobs WUJ:[id number] DOB: 08-04-1934 DOA: 12/21/2023 DOS: the patient was seen and examined on 12/22/2023 PCP: [name]., MD  Patient coming from: Home  Chief Complaint:  Chief Complaint  Patient presents with   Altered Mental Status   HPI: Beverly Jacobs is a 89 y.o. female with medical history significant of breast cancer, CAD, GERD, hyperlipidemia, left knee pain, peripheral neuropathy, dementia, macular degeneration, right hip bursitis, lumbar radiculopathy, stage III CKD, anemia due to CKD, depression, dry eye syndrome, bilateral foot drop, hyperlipidemia, interstitial cystitis, obesity, reduced mobility who was brought to the emergency department from her facility due to altered mental status.  She has had similar symptoms in the past when she gets a UTI.  She is unable to elaborate in detail about the HPI, but family is stating that her mental status seems to have improved since she received antibiotics and IV fluids.  She is able to answer simple questions.  No headache, dyspnea, chest pain at the moment.  She has some lower back pain.  Lab work: Her urinalysis showed small hemoglobinuria, small proteinuria, moderate leukocyte esterase, 11-20 WBC, 11-20 RBC and many bacteria on microscopic examination.  CBC showed a white count of 7.1, hemoglobin 9.7 g/dL platelets 96.  Lipase was normal.  CMP showed a sodium 134, potassium 3.1, chloride 101 and CO2 22 mmol/L with an anion gap of 11.  Glucose on 113, BUN 36, creatinine 1.08 and calcium is borderline normal after correction.  Hepatic functions were normal with the exception of a mildly increased total bilirubin at 1.40 mg/dL.  Coronavirus, influenza and RSV PCR negative.  Imaging: Portable 1 view chest radiograph showing cardiomegaly with central pulmonary vascular congestion.  Small left pleural effusions with patchy airspace opacity in the left costophrenic angle, likely atelectasis.  CT  head without contrast was normal.  CT renal study showing mild cardiomegaly, aortic atherosclerosis, cholelithiasis, but no acute intra-abdominal pathology identified.   ED course: Initial vital signs were temperature 98.2 F, pulse 74, respiration 25, BP 139/94 mmHg O2 sat 100% on room air.  The patient received ceftriaxone 1 g IVPB, ketorolac 15 mg IVP and 500 mL IV bolus.  Review of Systems: As mentioned in the history of present illness. All other systems reviewed and are negative.  Past Medical History:  Diagnosis Date   Cancer [facility])    breast   Coronary artery disease    GERD (gastroesophageal reflux disease)    Hyperlipidemia with target low density lipoprotein (LDL) cholesterol less than 100 mg/dL    Knee pain, left    Neuropathy    Past Surgical History:  Procedure Laterality Date   ABDOMINAL HYSTERECTOMY     COLONOSCOPY W/ POLYPECTOMY     eye Left    FOOT ARTHRODESIS Left    MASTECTOMY     Social History:  reports that she has quit smoking. She has never used smokeless tobacco. She reports that she does not drink alcohol and does not use drugs.  Allergies  Allergen Reactions   Elemental Sulfur Anaphylaxis   Lortab [Hydrocodone-Acetaminophen]    Sulfa Antibiotics    Atorvastatin    Macrobid [Nitrofurantoin]    Percocet [Oxycodone-Acetaminophen]    Reclast [Zoledronic Acid]    Statins    Zithromax [Azithromycin]     Family History  Problem Relation Age of Onset   Cancer Mother    Cancer Father    Coronary artery disease Father    Alzheimer's disease Neg  Hx    Dementia Neg Hx     Prior to Admission medications   Medication Sig Start Date End Date Taking? Authorizing Provider  acetaminophen (TYLENOL) 500 MG tablet Take 1,000 mg by mouth See admin instructions. Take 1000mg  by mouth twice a day, also allowed to take every six hours as needed   Yes [provider]  ALPRAZolam (XANAX) 0.25 MG tablet Take 1 tablet (0.25 mg total) by mouth 3 (three) times  daily as needed for anxiety. This is to be used as needed in between clonazepam doses but only for severe agitation that is not redirectable and also for patient having significant hallucinations, delusions, anxiety especially if the patient is significantly upset or is there is danger to the patient or others. 11/25/23  Yes Anson Fret, MD  brexpiprazole (REXULTI) 1 MG TABS tablet Take 1 tablet (1 mg total) by mouth daily. 11/23/23  Yes Anson Fret, MD  ciprofloxacin (CIPRO) 250 MG tablet Take 250 mg by mouth as directed. Patient not taking: Reported on 12/22/2023 11/10/22   [provider]  clonazepam (KLONOPIN) 0.125 MG disintegrating tablet Take 1 tablet (0.125 mg total) by mouth 3 (three) times daily. 12/01/23  Yes Anson Fret, MD  divalproex (DEPAKOTE SPRINKLE) 125 MG capsule Take 1 capsule (125 mg total) by mouth 2 (two) times daily. 12/07/23  Yes Anson Fret, MD  Propylene Glycol (SYSTANE COMPLETE OP) Apply 1 drop to eye every 6 (six) hours as needed (Dry eyes).   Yes [provider]  sertraline (ZOLOFT) 100 MG tablet Take 1 tablet (100 mg total) by mouth daily. 12/02/23  Yes Anson Fret, MD  diclofenac sodium (VOLTAREN) 1 % GEL Apply 4 g topically 4 (four) times daily. Rub in to your right knee joint as prescribed Patient not taking: Reported on 12/22/2023 04/08/16   Antony Madura, PA-C  esomeprazole (NEXIUM) 40 MG capsule Take 40 mg by mouth daily before breakfast. Patient not taking: Reported on 12/22/2023    [provider]  naproxen sodium (ALEVE) 220 MG tablet Take 220 mg by mouth as needed. Patient not taking: Reported on 12/22/2023    [provider]    Physical Exam: Vitals:   12/21/23 2327 12/22/23 0030 12/22/23 0645  BP: (!) 139/94 (!) 113/54 (!) 113/57  Pulse: 74 82 66  Resp: (!) 25 (!) 24 (!) 21  Temp: 98.2 F (36.8 C)    TempSrc: Oral    SpO2: 100% 100% 94%   Physical Exam Vitals and nursing note reviewed.   Constitutional:      General: She is awake. She is not in acute distress.    Appearance: Normal appearance. She is ill-appearing.  HENT:     Head: Normocephalic.     Nose: No rhinorrhea.     Mouth/Throat:     Mouth: Mucous membranes are dry.  Eyes:     General: No scleral icterus.    Pupils: Pupils are equal, round, and reactive to light.  Neck:     Vascular: No JVD.  Cardiovascular:     Rate and Rhythm: Normal rate and regular rhythm.     Heart sounds: S1 normal and S2 normal.  Pulmonary:     Effort: Pulmonary effort is normal.     Breath sounds: Normal breath sounds. No wheezing, rhonchi or rales.  Abdominal:     General: Bowel sounds are normal. There is no distension.     Palpations: Abdomen is soft.     Tenderness:  There is abdominal tenderness in the suprapubic area. There is no right CVA tenderness, left CVA tenderness, guarding or rebound.  Musculoskeletal:     Cervical back: Neck supple.     Right lower leg: No edema.     Left lower leg: No edema.  Skin:    General: Skin is warm and dry.  Neurological:     General: No focal deficit present.     Mental Status: She is alert. She is disoriented.  Psychiatric:        Mood and Affect: Mood normal.        Behavior: Behavior normal. Behavior is cooperative.     Data Reviewed:  Results are pending, will review when available.  EKG: Vent. rate 73 BPM PR interval 172 ms QRS duration 106 ms QT/QTcB 426/470 ms P-R-T axes 2 61 79 Sinus rhythm RSR' in V1 or V2, probably normal variant Nonspecific T abnrm, anterolateral leads  Assessment and Plan: Principal Problem:   Acute metabolic encephalopathy In the setting of:   Acute UTI (urinary tract infection)  Telemetry/inpatient. Continue IV fluids. Keep n.p.o. for now. Analgesics as needed. Antiemetics as needed. Follow CBC, CMP in AM.  Active Problems:   Depression   Advanced dementia (HCC) Supportive care. Hold benzodiazepines. Continue sertraline 100  mg p.o. daily. Continue treatment as above.    Anemia in CKD (chronic kidney disease)   Monitor hematocrit and hemoglobin. Monitor renal function electrolytes.   GERD (gastroesophageal reflux disease) Continue home PPI or formulary equivalent.    Hyperlipemia   History of CAD Currently not on medical therapy. Follow-up with primary care provider.    Advance Care Planning:   Code Status: Full Code   Consults:   Family Communication: Her granddaughter was at bedside.  Severity of Illness: The appropriate patient status for this patient is INPATIENT. Inpatient status is judged to be reasonable and necessary in order to provide the required intensity of service to ensure the patient's safety. The patient's presenting symptoms, physical exam findings, and initial radiographic and laboratory data in the context of their chronic comorbidities is felt to place them at high risk for further clinical deterioration. Furthermore, it is not anticipated that the patient will be medically stable for discharge from the hospital within 2 midnights of admission.   * I certify that at the point of admission it is my clinical judgment that the patient will require inpatient hospital care spanning beyond 2 midnights from the point of admission due to high intensity of service, high risk for further deterioration and high frequency of surveillance required.*  Author: Bobette Mo, MD 12/22/2023 7:41 AM  For on call review [email or web address].   This document was prepared using Dragon voice recognition software and may contain some unintended transcription errors.

## 2023-12-22 NOTE — Plan of Care (Signed)

## 2023-12-22 NOTE — Progress Notes (Signed)
  Carryover admission to the Day Admitter.  I discussed this case with the EDP, Dr. Jacqulyn Bath.  Per these discussions:   This is a 89 year old female with history of Alzheimer's dementia, presenting from SNF with confusion, somnolence relative to her baseline mental status over the last 3 to 4 days.  Over the course of the last week, she is an some intermittent loose stool, but has been refusing hygiene intervention from staff, is also reported to have had diminished oral intake over the last several days.  Family at bedside confirms that the patient's presenting mental status is altered relative to her baseline dementia.  Urinalysis was reported to be suggestive of UTI, for which she was started on Rocephin.  CT head showed no evidence of acute intracranial process.  She was also started on some gentle IV fluids, for clinical suggestion of mild dehydration.  I have placed an order for inpatient admission to med/tele for further evaluation management of the above.  I have placed some additional preliminary admit orders via the adult multi-morbid admission order set. I have also ordered continuation of the Rocephin that was initiated in the ED.  Of also ordered morning labs in the form of BMP, CBC, magnesium level.    Newton Pigg, DO Hospitalist

## 2023-12-23 DIAGNOSIS — G9341 Metabolic encephalopathy: Secondary | ICD-10-CM | POA: Diagnosis not present

## 2023-12-23 LAB — URINE CULTURE

## 2023-12-23 MED ORDER — CLONAZEPAM 0.125 MG PO TBDP
0.1250 mg | ORAL_TABLET | Freq: Three times a day (TID) | ORAL | Status: DC
  Administered 2023-12-23 – 2023-12-25 (×8): 0.125 mg via ORAL
  Filled 2023-12-23 (×8): qty 1

## 2023-12-23 MED ORDER — CLONAZEPAM 0.125 MG PO TBDP: 0.1250 mg | ORAL_TABLET | Freq: Three times a day (TID) | ORAL | Status: DC

## 2023-12-23 NOTE — Plan of Care (Signed)

## 2023-12-23 NOTE — Progress Notes (Signed)
  [facility] Progress Note  Patient: Beverly Jacobs     ZOX:[id number]  DOA: 12/21/2023   PCP: [name]., MD       Brief hospital course: This is an 89 year old female with Alzheimer's dementia, history of breast cancer, coronary artery disease, macular degeneration, peripheral neuropathy who presented to the hospital for worsening in mental status from her facility.  In the ED, she was found to have a possible UTI and was therefore started on ceftriaxone.  Subjective:  According to the patient's daughter, the patient is severely confused and much worse than her baseline.  The patient was also retaining urine yesterday and needed an In-N-Out cath.  She has not voided yet today.  Assessment and Plan: Principal Problem:   Acute metabolic encephalopathy, UTI? -Has dementia at baseline -Will resume clonazepam and continue her other home medications -It is possible this may be secondary to UTI-can continue ceftriaxone -Urine culture shows multiple species and was likely contaminated  Active Problems: Urinary retention - Will DC Foley and monitor for urine output    Advanced dementia (HCC) -Continue Rexulti, clonazepam, Depakote and Zoloft  Wheelchair-bound - She is able to transition to wheelchair on her own at baseline       Code Status: Full Code Total time on patient care: 35 minutes DVT prophylaxis:  SCDs Start: 12/22/23 0352     Objective:   Vitals:   12/23/23 0234 12/23/23 0500 12/23/23 0525 12/23/23 1252  BP: 119/60  (!) 140/75 (!) 154/74  Pulse: 83  73 76  Resp: 18  18   Temp: 97.9 F (36.6 C)  98.3 F (36.8 C) 97.6 F (36.4 C)  TempSrc: Oral  Oral   SpO2: 98%  99% 100%  Weight:  73.6 kg     Filed Weights   12/23/23 0500  Weight: 73.6 kg   Exam: General exam: Appears comfortable  HEENT: oral mucosa moist Respiratory system: Clear to auscultation.  Cardiovascular system: S1 & S2 heard  Gastrointestinal system: Abdomen soft,  non-tender, nondistended. Normal bowel sounds   Extremities: No cyanosis, clubbing or edema Neurologic: Oriented only to person, cranial nerves II through XII intact-moves all 4 extremities Psychiatry:  Mood & affect appropriate.    CBC: Recent Labs  Lab 12/21/23 2323 12/22/23 1442  WBC 7.1 7.6  NEUTROABS 6.4 5.6  HGB 9.7* 10.2*  HCT 28.1* 29.4*  MCV 82.6 81.7  PLT 96* 85*   Basic Metabolic Panel: Recent Labs  Lab 12/21/23 2323 12/22/23 1442  NA 134* 135  K 3.1* 3.7  CL 101 103  CO2 22 22  GLUCOSE 113* 85  BUN 36* 31*  CREATININE 1.08* 0.72  CALCIUM 8.6* 8.5*  MG  --  1.9     Scheduled Meds:  brexpiprazole  1 mg Oral Daily   clonazepam  0.125 mg Oral TID   divalproex  125 mg Oral BID   sertraline  100 mg Oral Daily    Imaging and lab data personally reviewed   Author: Calvert Cantor  12/23/2023 1:08 PM  To contact [facility]>   Check the care team in [place] and look for the attending/consulting TR[facility] provider listed  Log into [email or web address] and use [place]'s universal password   Go to> "[facility]"  and find provider  If you still have difficulty reaching the provider, please page the [facility] (Director on Call) for the Hospitalists listed on amion

## 2023-12-23 NOTE — TOC Initial Note (Addendum)
 Transition of Care [facility]) - Initial/Assessment Note    Patient Details  Name: Beverly Jacobs MRN: [id number] Date of Birth: 21-Oct-1933  Transition of Care [facility]) CM/SW Contact:    Jessie Foot, RN Phone Number: [phone] 12/23/2023, 3:22 PM  Clinical Narrative:                 Patient presented for Acute metabolic encephalopathy via EMS from [facility] (private room). PTA for the last month patient was able to stand and pivot to wheelchair. Patient was not ambulatory. Active with [facility] for PT & OT. Also active with [facility].  At the bedside was the daughter [name]) & [name] (granddaughter) that are supportive. Daughter reported that [name] has a Dispensing optician) comes to [place] weekly and has transportation for appointments. Daughter also said she can transport. Currently active with [facility] for PT/OT and [facility] [facility] support program which is apart of Palliative). Patient will require PTAR for transportation at discharge. Nurse case manager will continue to follow the patient as she progression.   Expected Discharge Plan: Memory Care Barriers to Discharge: Continued Medical Work up   Patient Goals and CMS Choice Patient states their goals for this hospitalization and ongoing recovery are:: Return to [facility]          Expected Discharge Plan and Services In-house Referral: NA Discharge Planning Services: CM Consult   Living arrangements for the past 2 months: Assisted Living Facility (Memory Care)                   DME Agency: NA       HH Arranged: NA HH Agency: [facility] currently active)        Prior Living Arrangements/Services Living arrangements for the past 2 months: Assisted Living Facility (Memory Care) Lives with:: Self Patient language and need for interpreter reviewed:: Yes Do you feel safe going back to the place where you live?: Yes       Need for Family Participation in Patient Care: No (Comment) Care giver support system in place?: Yes (comment) Current home services: DME, Hospice, Home OT, Home PT ([facility], [facility] for PT/OT) Criminal Activity/Legal Involvement Pertinent to Current Situation/Hospitalization: No - Comment as needed  Activities of Daily Living      Permission Sought/Granted Permission sought to share information with : Case Production designer, theatre/television/film, Oceanographer granted to share information with : Yes, Verbal Permission Granted  Share Information with NAME: Nicholos Johns  Permission granted to share info w AGENCY: [facility], [facility] granted to share info w Relationship: Daughter  Permission granted to share info w Contact Information: [phone]  Emotional Assessment Appearance:: Appears stated age Attitude/Demeanor/Rapport: Engaged Affect (typically observed): Accepting, Appropriate Orientation: : Oriented to Self, Oriented to Place, Oriented to  Time, Oriented to Situation Alcohol / Substance Use: Not Applicable Psych Involvement: No (comment)  Admission diagnosis:  Disorientation [R41.0] Acute cystitis without hematuria [N30.00] Acute metabolic encephalopathy [G93.41] Patient Active Problem List   Diagnosis Date Noted   Acute metabolic encephalopathy 12/22/2023   Depression 12/22/2023   Acute UTI (urinary tract infection) 12/22/2023   s 11/25/2023   Advanced dementia (HCC) 11/25/2023   Dry eye syndrome of both eyes 09/14/2023   Pseudophakia, both eyes 09/14/2023   Interstitial cystitis 10/13/2022   Chronic diarrhea of unknown origin 08/20/2022   Other reduced mobility 08/20/2022   Trochanteric bursitis of right hip 07/19/2020  Foot drop, bilateral 01/04/2018   Peripheral neuropathy 10/20/2017   Vitamin B12 deficiency 12/18/2015   Anemia in CKD (chronic kidney disease) 03/13/2015   Acute lumbar radiculopathy 04/20/2014    Chronic kidney disease, stage III (moderate) (HCC) 04/20/2014   History of ST elevation myocardial infarction 04/20/2014   Vitamin D deficiency 04/20/2014   Recurrent major depressive disorder, in full remission (HCC) 04/20/2014   GERD (gastroesophageal reflux disease) 03/31/2014   Hyperlipemia 03/31/2014   Obesity 03/31/2014   PCP:  [name]., MD Pharmacy:   [facility] - [place], [place] - [phone] [address] [place] [address] Phone: [phone] Fax: [phone]     Social Drivers of Health (SDOH) Social History: SDOH Screenings   Food Insecurity: Patient Declined (12/23/2023)  Housing: Unknown (12/23/2023)  Transportation Needs: No Transportation Needs (12/23/2023)  Utilities: Not At Risk (12/23/2023)  Financial Resource Strain: Low Risk  (01/11/2023)   Received from [facility], [facility]  Physical Activity: Unknown (01/11/2023)   [facility] from [facility], [facility]  Social Connections: Patient Declined (12/23/2023)  Stress: Patient Declined (01/11/2023)   Received from [facility], [facility]  Tobacco Use: Medium Risk (07/30/2023)   SDOH Interventions:     Readmission Risk Interventions     No data to display

## 2023-12-24 ENCOUNTER — Inpatient Hospital Stay (HOSPITAL_COMMUNITY)

## 2023-12-24 DIAGNOSIS — G9341 Metabolic encephalopathy: Secondary | ICD-10-CM | POA: Diagnosis not present

## 2023-12-24 MED ORDER — PANTOPRAZOLE SODIUM 40 MG PO TBEC
40.0000 mg | DELAYED_RELEASE_TABLET | Freq: Every day | ORAL | Status: DC
  Administered 2023-12-24 – 2023-12-25 (×2): 40 mg via ORAL
  Filled 2023-12-24 (×2): qty 1

## 2023-12-24 MED ORDER — BISACODYL 10 MG RE SUPP
10.0000 mg | Freq: Once | RECTAL | Status: AC
  Administered 2023-12-24: 10 mg via RECTAL
  Filled 2023-12-24: qty 1

## 2023-12-24 MED ORDER — ALPRAZOLAM 0.25 MG PO TABS
0.2500 mg | ORAL_TABLET | Freq: Three times a day (TID) | ORAL | Status: DC | PRN
  Administered 2023-12-25: 0.25 mg via ORAL
  Filled 2023-12-24: qty 1

## 2023-12-24 NOTE — NC FL2 (Signed)
 [place] MEDICAID FL2 LEVEL OF CARE FORM     IDENTIFICATION  Patient Name: Beverly Jacobs Birthdate: [birth year] Sex: female Admission Date (Current Location): 12/21/2023  [facility] and [place] Number:  Producer, television/film/video and Address:  [facility], [address] [place], [place] [address]      Provider Number: [id number]  Attending Physician Name and Address:  Calvert Cantor, MD  Relative Name and Phone Number:  [name]   [phone]    Current Level of Care: Hospital Recommended Level of Care: Skilled Nursing Facility Prior Approval Number:    Date Approved/Denied:   PASRR Number:    Discharge Plan: SNF    Current Diagnoses: Patient Active Problem List   Diagnosis Date Noted   Acute metabolic encephalopathy 12/22/2023   Depression 12/22/2023   Acute UTI (urinary tract infection) 12/22/2023   s 11/25/2023   Advanced dementia (HCC) 11/25/2023   Dry eye syndrome of both eyes 09/14/2023   Pseudophakia, both eyes 09/14/2023   Interstitial cystitis 10/13/2022   Chronic diarrhea of unknown origin 08/20/2022   Other reduced mobility 08/20/2022   Trochanteric bursitis of right hip 07/19/2020   Foot drop, bilateral 01/04/2018   Peripheral neuropathy 10/20/2017   Vitamin B12 deficiency 12/18/2015   Anemia in CKD (chronic kidney disease) 03/13/2015   Acute lumbar radiculopathy 04/20/2014   Chronic kidney disease, stage III (moderate) (HCC) 04/20/2014   History of ST elevation myocardial infarction 04/20/2014   Vitamin D deficiency 04/20/2014   Recurrent major depressive disorder, in full remission (HCC) 04/20/2014   GERD (gastroesophageal reflux disease) 03/31/2014   Hyperlipemia 03/31/2014   Obesity 03/31/2014    Orientation RESPIRATION BLADDER Height & Weight     Self, Time, Situation, Place  Normal Incontinent Weight: 73.6 kg Height:     BEHAVIORAL SYMPTOMS/MOOD NEUROLOGICAL BOWEL NUTRITION STATUS      Incontinent Diet (See  discharge summary)  AMBULATORY STATUS COMMUNICATION OF NEEDS Skin   Extensive Assist Verbally Normal                       Personal Care Assistance Level of Assistance  Bathing, Feeding, Dressing Bathing Assistance: Maximum assistance Feeding assistance: Limited assistance Dressing Assistance: Maximum assistance     Functional Limitations Info             SPECIAL CARE FACTORS FREQUENCY  PT (By licensed PT), OT (By licensed OT)     PT Frequency: 5X weekly OT Frequency: 5X weekly            Contractures Contractures Info: Not present    Additional Factors Info  Code Status, Allergies, Psychotropic Code Status Info: FULL Allergies Info: Elemental Sulfur, Lortab (Hydrocodone-acetaminophen), Sulfa Antibiotics, Atorvastatin, Macrobid (Nitrofurantoin), Percocet (Oxycodone-acetaminophen), Reclast (Zoledronic Acid), Statins, Zithromax (Azithromycin) Psychotropic Info: Xanax, Klonopin, Depakote         Current Medications (12/24/2023):  This is the current hospital active medication list Current Facility-Administered Medications  Medication Dose Route Frequency Provider Last Rate Last Admin   acetaminophen (TYLENOL) tablet 650 mg  650 mg Oral Q6H PRN Howerter, Justin B, DO   650 mg at 12/23/23 1545   Or   acetaminophen (TYLENOL) suppository 650 mg  650 mg Rectal Q6H PRN Howerter, Justin B, DO       ALPRAZolam Prudy Feeler) tablet 0.25 mg  0.25 mg Oral TID PRN Calvert Cantor, MD       brexpiprazole (REXULTI) tablet 1 mg  1 mg Oral Daily Bobette Mo,  MD   1 mg at 12/24/23 1015   cefTRIAXone (ROCEPHIN) 1 g in sodium chloride 0.9 % 100 mL IVPB  1 g Intravenous Q24H Howerter, Justin B, DO 200 mL/hr at 12/24/23 0521 1 g at 12/24/23 0521   clonazepam (KLONOPIN) disintegrating tablet 0.125 mg  0.125 mg Oral TID Calvert Cantor, MD   0.125 mg at 12/24/23 1016   divalproex (DEPAKOTE SPRINKLE) capsule 125 mg  125 mg Oral BID Bobette Mo, MD   125 mg at 12/24/23 1016    melatonin tablet 3 mg  3 mg Oral QHS PRN Howerter, Justin B, DO   3 mg at 12/23/23 0019   methocarbamol (ROBAXIN) tablet 500 mg  500 mg Oral Q8H PRN Bobette Mo, MD   500 mg at 12/23/23 1545   ondansetron (ZOFRAN) injection 4 mg  4 mg Intravenous Q6H PRN Howerter, Justin B, DO       pantoprazole (PROTONIX) EC tablet 40 mg  40 mg Oral Daily Calvert Cantor, MD   40 mg at 12/24/23 1016   sertraline (ZOLOFT) tablet 100 mg  100 mg Oral Daily Bobette Mo, MD   100 mg at 12/24/23 1015     Discharge Medications: Please see discharge summary for a list of discharge medications.  Relevant Imaging Results:  Relevant Lab Results:   Additional Information SS#  [id number]  Jessie Foot, RN

## 2023-12-24 NOTE — Evaluation (Signed)
 Physical Therapy Evaluation Patient Details Name: Beverly Jacobs MRN: [id number] DOB: Jan 13, 1934 Today's Date: 12/24/2023  History of Present Illness  This is an 89 year old female who presented to the hospital 12/21/23 for worsening in mental status from her facility.  In the ED, she was found to have a possible UTI. PMH: Alzheimer's dementia, history of breast cancer, coronary artery disease, macular degeneration, peripheral neuropath  Clinical Impression  Pt admitted with above diagnosis.  Pt currently with functional limitations due to the deficits listed below (see PT Problem List). Pt will benefit from acute skilled PT to increase their independence and safety with mobility to allow discharge.     The patient is very pleasant, family present to provide information of PLOF. Patient was mod I with trnasfers and WC mobility until ~ 4 weeks ago, more recently requiring assistance for transfers. Patient resides in ALF.  Assisted patient to and from [facility], max of 2 persons. Patient will benefit from continued inpatient follow up therapy, <3 hours/day.       If plan is discharge home, recommend the following: Two people to help with walking and/or transfers;A lot of help with bathing/dressing/bathroom;Assist for transportation   Can travel by private vehicle    no    Equipment Recommendations None recommended by PT  Recommendations for Other Services       Functional Status Assessment Patient has had a recent decline in their functional status and demonstrates the ability to make significant improvements in function in a reasonable and predictable amount of time.     Precautions / Restrictions Precautions Precautions: Fall Restrictions Weight Bearing Restrictions Per Provider Order: No      Mobility  Bed Mobility Overal bed mobility: Needs Assistance Bed Mobility: Supine to Sit     Supine to sit: Mod assist, HOB elevated, Used rails     General bed mobility comments: extra  time, patient does make effort, bed  pad to slide forward    Transfers Overall transfer level: Needs assistance Equipment used: 2 person hand held assist Transfers: Sit to/from Stand, Bed to chair/wheelchair/BSC Sit to Stand: Max assist, +2 physical assistance, +2 safety/equipment           General transfer comment: max assist to stand and pivot to  [facility] and back to bed, patient feet sliding forward.    Ambulation/Gait                  Stairs            Wheelchair Mobility     Tilt Bed    Modified Rankin (Stroke Patients Only)       Balance Overall balance assessment: Needs assistance Sitting-balance support: Feet supported, Bilateral upper extremity supported Sitting balance-Leahy Scale: Fair     Standing balance support: During functional activity, Bilateral upper extremity supported Standing balance-Leahy Scale: Zero                               Pertinent Vitals/Pain Pain Assessment Pain Assessment: No/denies pain    Home Living Family/patient expects to be discharged to:: Assisted living                 Home Equipment: Wheelchair - manual      Prior Function Prior Level of Function : Needs assist  Cognitive Assist : Mobility (cognitive);ADLs (cognitive)     Physical Assist : Mobility (physical) Mobility (physical): Bed mobility;Transfers   Mobility Comments: per family, transfers  with 1 person, 4 weeks ago was independnet transfers,  mobilizes in [facility] with legs independnetly. ADLs Comments: staff assisting     Extremity/Trunk Assessment   Upper Extremity Assessment Upper Extremity Assessment: Defer to OT evaluation    Lower Extremity Assessment Lower Extremity Assessment: Generalized weakness    Cervical / Trunk Assessment Cervical / Trunk Assessment: Normal  Communication   Communication Communication: No apparent difficulties    Cognition Arousal: Alert Behavior During Therapy: WFL for tasks  assessed/performed   PT - Cognitive impairments: History of cognitive impairments                         Following commands: Impaired Following commands impaired: Follows one step commands with increased time     Cueing Cueing Techniques: Verbal cues, Gestural cues     General Comments      Exercises     Assessment/Plan    PT Assessment Patient needs continued PT services  PT Problem List Decreased strength;Decreased activity tolerance;Decreased mobility;Decreased cognition       PT Treatment Interventions DME instruction;Therapeutic activities;Cognitive remediation;Therapeutic exercise;Functional mobility training;Patient/family education    PT Goals (Current goals can be found in the Care Plan section)  Acute Rehab PT Goals Patient Stated Goal: get better PT Goal Formulation: With patient/family Time For Goal Achievement: 01/07/24 Potential to Achieve Goals: Good    Frequency Min 2X/week     Co-evaluation               AM-PAC PT "6 Clicks" Mobility  Outcome Measure Help needed turning from your back to your side while in a flat bed without using bedrails?: A Lot Help needed moving from lying on your back to sitting on the side of a flat bed without using bedrails?: A Lot Help needed moving to and from a bed to a chair (including a wheelchair)?: Total Help needed standing up from a chair using your arms (e.g., wheelchair or bedside chair)?: Total Help needed to walk in hospital room?: Total Help needed climbing 3-5 steps with a railing? : Total 6 Click Score: 8    End of Session Equipment Utilized During Treatment: Gait belt Activity Tolerance: Patient tolerated treatment well Patient left: in bed;with call bell/phone within reach;with bed alarm set;with family/visitor present Nurse Communication: Mobility status;Need for lift equipment PT Visit Diagnosis: Unsteadiness on feet (R26.81)    Time: 1435-1500 PT Time Calculation (min) (ACUTE  ONLY): 25 min   Charges:   PT Evaluation $PT Eval Low Complexity: 1 Low PT Treatments $Therapeutic Activity: 8-22 mins PT General Charges $$ ACUTE PT VISIT: 1 Visit         Blanchard Kelch PT Acute Rehabilitation Services Office [phone] Weekend pager-[phone]   Rada Hay 12/24/2023, 3:09 PM

## 2023-12-24 NOTE — Progress Notes (Signed)
 [facility] Progress Note  Patient: Beverly Jacobs     WGN:[id number]  DOA: 12/21/2023   PCP: [name]., MD       Brief hospital course: This is an 89 year old female with Alzheimer's dementia, history of breast cancer, coronary artery disease, macular degeneration, peripheral neuropathy who presented to the hospital for worsening in mental status from her facility.  In the ED, she was found to have a possible UTI and was therefore started on ceftriaxone.  Subjective:  The patient is less confused today.  She has pain in her right knee ankle and foot.  She was not able to urinate on her own and required an I&O cath yesterday evening.  Assessment and Plan: Principal Problem:   Acute metabolic encephalopathy, UTI? -Has dementia at baseline -Will resume clonazepam and continue her other home medications -It is possible this may be secondary to UTI-can continue ceftriaxone -Urine culture shows multiple species and was likely contaminated  Active Problems: Urinary retention - Still unable to urinate independently  -will likely need to place Foley today-have discussed that she will need a close outpatient follow-up with urology    Advanced dementia (HCC) -Continue Rexulti, clonazepam, Depakote and Zoloft  Wheelchair-bound - She is able to transition to wheelchair on her own at baseline but now says she is too weak to get out of bed on her own - PT eval ordered  Right knee and right ankle weakness and pain - She has a number of bruises over her right leg and states that she fell on it a few days ago - Imaging reveals osteoporosis and osteoarthritis-no acute fractures       Code Status: Full Code Total time on patient care: 35 minutes DVT prophylaxis:  SCDs Start: 12/22/23 0352     Objective:   Vitals:   12/23/23 2015 12/24/23 0500 12/24/23 0611 12/24/23 1201  BP: (!) 150/72  (!) 166/69 (!) 151/71  Pulse: 75  74 84  Resp: 16  16 18   Temp: (!) 97.5 F  (36.4 C)  97.9 F (36.6 C) 98.6 F (37 C)  TempSrc:      SpO2: 95%  97% 98%  Weight:  73.6 kg     Filed Weights   12/23/23 0500 12/24/23 0500  Weight: 73.6 kg 73.6 kg   Exam: General exam: Appears comfortable  HEENT: oral mucosa moist Respiratory system: Clear to auscultation.  Cardiovascular system: S1 & S2 heard  Gastrointestinal system: Abdomen soft, non-tender, nondistended. Normal bowel sounds   Extremities: No cyanosis, clubbing or edema-tenderness in right ankle and foot Neurologic: Oriented only to person, cranial nerves II through XII intact-moves all 4 extremities Psychiatry:  Mood & affect appropriate.    CBC: Recent Labs  Lab 12/21/23 2323 12/22/23 1442  WBC 7.1 7.6  NEUTROABS 6.4 5.6  HGB 9.7* 10.2*  HCT 28.1* 29.4*  MCV 82.6 81.7  PLT 96* 85*   Basic Metabolic Panel: Recent Labs  Lab 12/21/23 2323 12/22/23 1442  NA 134* 135  K 3.1* 3.7  CL 101 103  CO2 22 22  GLUCOSE 113* 85  BUN 36* 31*  CREATININE 1.08* 0.72  CALCIUM 8.6* 8.5*  MG  --  1.9     Scheduled Meds:  brexpiprazole  1 mg Oral Daily   clonazepam  0.125 mg Oral TID   divalproex  125 mg Oral BID   pantoprazole  40 mg Oral Daily   sertraline  100 mg Oral Daily    Imaging and lab data  personally reviewed   Author: Calvert Cantor  12/24/2023 2:55 PM  To contact [facility]>   Check the care team in [facility] and look for the attending/consulting [facility] provider listed  Log into [email or web address] and use [place]'s universal password   Go to> "[facility]"  and find provider  If you still have difficulty reaching the provider, please page the [facility] (Director on Call) for the Hospitalists listed on amion

## 2023-12-24 NOTE — Progress Notes (Addendum)
 Pt was bladder scanned at 2200 showing 319 cc. Pt attempted to void but could not. Second bladder scan performed at 0540 showing 484 cc of urine. Pt attempted to void but unable. 0600 In and out cath performed. 680 dark urine collected

## 2023-12-24 NOTE — TOC Progression Note (Signed)
 Transition of Care [facility]) - Progression Note    Patient Details  Name: Beverly Jacobs MRN: [id number] Date of Birth: December 20, 1933  Transition of Care [facility]) CM/SW Contact  Jessie Foot, RN Phone Number: 12/24/2023, 4:26 PM  Clinical Narrative:    Entered the patients' room, patients' daughter, 2 granddaughters, and family friend (Physical Therapist) present. Case Manager discussed SNF placement, and patient and daughter as agreeable to service. Patients daughter (POA) requested [facility] for SNF placement.  Expected Discharge Plan: Memory Care Barriers to Discharge: Continued Medical Work up  Expected Discharge Plan and Services In-house Referral: NA Discharge Planning Services: CM Consult   Living arrangements for the past 2 months: Assisted Living Facility (Memory Care)                   DME Agency: NA       HH Arranged: NA HH Agency: [facility] currently active)         Social Determinants of Health (SDOH) Interventions SDOH Screenings   Food Insecurity: Patient Declined (12/23/2023)  Housing: Unknown (12/23/2023)  Transportation Needs: No Transportation Needs (12/23/2023)  Utilities: Not At Risk (12/23/2023)  Financial Resource Strain: Low Risk  (01/11/2023)   Received from [facility], [facility]  Physical Activity: Unknown (01/11/2023)   Received from [facility], [facility]  Social Connections: Patient Declined (12/23/2023)  Stress: Patient Declined (01/11/2023)   Received from [facility], [facility]  Tobacco Use: Medium Risk (07/30/2023)    Readmission Risk Interventions     No data to display

## 2023-12-25 DIAGNOSIS — R338 Other retention of urine: Secondary | ICD-10-CM | POA: Insufficient documentation

## 2023-12-25 DIAGNOSIS — G9341 Metabolic encephalopathy: Secondary | ICD-10-CM | POA: Diagnosis not present

## 2023-12-25 MED ORDER — ALPRAZOLAM 0.25 MG PO TABS: 0.2500 mg | ORAL_TABLET | Freq: Three times a day (TID) | ORAL | 5 refills | Status: AC | PRN

## 2023-12-25 MED ORDER — CLONAZEPAM 0.125 MG PO TBDP: 0.1250 mg | ORAL_TABLET | Freq: Three times a day (TID) | ORAL | 4 refills | Status: DC

## 2023-12-25 MED ORDER — METHOCARBAMOL 500 MG PO TABS: 500.0000 mg | ORAL_TABLET | Freq: Three times a day (TID) | ORAL | Status: AC | PRN

## 2023-12-25 MED ORDER — DICLOFENAC SODIUM 1 % EX GEL: 2.0000 g | Freq: Four times a day (QID) | CUTANEOUS | Status: AC

## 2023-12-25 MED ORDER — DICLOFENAC SODIUM 1 % EX GEL
2.0000 g | Freq: Four times a day (QID) | CUTANEOUS | Status: DC
  Administered 2023-12-25: 2 g via TOPICAL
  Filled 2023-12-25: qty 100

## 2023-12-25 NOTE — NC FL2 (Signed)
 [place] MEDICAID FL2 LEVEL OF CARE FORM     IDENTIFICATION  Patient Name: Beverly Jacobs Birthdate: 22-Aug-1934 Sex: female Admission Date (Current Location): 12/21/2023  [facility] and [place] Number:  Producer, television/film/video and Address:  [facility], [address], [place], [place] [address]      Provider Number: [id number]  Attending Physician Name and Address:  Calvert Cantor, MD  Relative Name and Phone Number:  [name]   [phone]    Current Level of Care: Hospital Recommended Level of Care: Skilled Nursing Facility Prior Approval Number:    Date Approved/Denied:   PASRR Number:  [id number]  Discharge Plan: SNF    Current Diagnoses: Patient Active Problem List   Diagnosis Date Noted   Acute urinary retention 12/25/2023   Acute metabolic encephalopathy 12/22/2023   Depression 12/22/2023   Acute UTI (urinary tract infection) 12/22/2023   s 11/25/2023   Advanced dementia (HCC) 11/25/2023   Dry eye syndrome of both eyes 09/14/2023   Pseudophakia, both eyes 09/14/2023   Interstitial cystitis 10/13/2022   Chronic diarrhea of unknown origin 08/20/2022   Other reduced mobility 08/20/2022   Trochanteric bursitis of right hip 07/19/2020   Foot drop, bilateral 01/04/2018   Peripheral neuropathy 10/20/2017   Vitamin B12 deficiency 12/18/2015   Anemia in CKD (chronic kidney disease) 03/13/2015   Acute lumbar radiculopathy 04/20/2014   Chronic kidney disease, stage III (moderate) (HCC) 04/20/2014   History of ST elevation myocardial infarction 04/20/2014   Vitamin D deficiency 04/20/2014   Recurrent major depressive disorder, in full remission (HCC) 04/20/2014   GERD (gastroesophageal reflux disease) 03/31/2014   Hyperlipemia 03/31/2014   Obesity 03/31/2014    Orientation RESPIRATION BLADDER Height & Weight     Self, Time, Situation, Place  Normal Incontinent Weight: 73.4 kg Height:     BEHAVIORAL SYMPTOMS/MOOD NEUROLOGICAL  BOWEL NUTRITION STATUS      Incontinent Diet (See discharge summary)  AMBULATORY STATUS COMMUNICATION OF NEEDS Skin   Extensive Assist Verbally Normal                       Personal Care Assistance Level of Assistance  Bathing, Feeding, Dressing Bathing Assistance: Maximum assistance Feeding assistance: Limited assistance Dressing Assistance: Maximum assistance     Functional Limitations Info             SPECIAL CARE FACTORS FREQUENCY  PT (By licensed PT), OT (By licensed OT)     PT Frequency: 5X weekly OT Frequency: 5X weekly            Contractures Contractures Info: Not present    Additional Factors Info  Code Status, Allergies, Psychotropic Code Status Info: FULL Allergies Info: Elemental Sulfur, Lortab (Hydrocodone-acetaminophen), Sulfa Antibiotics, Atorvastatin, Macrobid (Nitrofurantoin), Percocet (Oxycodone-acetaminophen), Reclast (Zoledronic Acid), Statins, Zithromax (Azithromycin) Psychotropic Info: Xanax, Klonopin, Depakote         Current Medications (12/25/2023):  This is the current hospital active medication list Current Facility-Administered Medications  Medication Dose Route Frequency Provider Last Rate Last Admin   acetaminophen (TYLENOL) tablet 650 mg  650 mg Oral Q6H PRN Howerter, Justin B, DO   650 mg at 12/23/23 1545   Or   acetaminophen (TYLENOL) suppository 650 mg  650 mg Rectal Q6H PRN Howerter, Justin B, DO       ALPRAZolam (XANAX) tablet 0.25 mg  0.25 mg Oral TID PRN Calvert Cantor, MD       brexpiprazole (REXULTI) tablet 1 mg  1  mg Oral Daily Bobette Mo, MD   1 mg at 12/25/23 1026   clonazepam (KLONOPIN) disintegrating tablet 0.125 mg  0.125 mg Oral TID Calvert Cantor, MD   0.125 mg at 12/25/23 1026   diclofenac Sodium (VOLTAREN) 1 % topical gel 2 g  2 g Topical QID Calvert Cantor, MD   2 g at 12/25/23 1456   divalproex (DEPAKOTE SPRINKLE) capsule 125 mg  125 mg Oral BID Bobette Mo, MD   125 mg at 12/25/23 1026    melatonin tablet 3 mg  3 mg Oral QHS PRN Howerter, Justin B, DO   3 mg at 12/24/23 2124   methocarbamol (ROBAXIN) tablet 500 mg  500 mg Oral Q8H PRN Bobette Mo, MD   500 mg at 12/25/23 1026   ondansetron [facility]) injection 4 mg  4 mg Intravenous Q6H PRN Howerter, Justin B, DO       pantoprazole (PROTONIX) EC tablet 40 mg  40 mg Oral Daily Calvert Cantor, MD   40 mg at 12/25/23 1026   sertraline (ZOLOFT) tablet 100 mg  100 mg Oral Daily Bobette Mo, MD   100 mg at 12/25/23 1026     Discharge Medications: Please see discharge summary for a list of discharge medications.  Relevant Imaging Results:  Relevant Lab Results:   Additional Information SS#  [id number]  Jessie Foot, RN

## 2023-12-25 NOTE — Discharge Summary (Signed)
 Physician Discharge Summary  Beverly Jacobs NWG:[id number] DOB: [birth year] DOA: 12/21/2023  PCP: [name]., MD  Admit date: 12/21/2023 Discharge date: 12/25/2023 Discharging to: SNF Recommendations for Outpatient Follow-up:  The patient needs follow-up with urology-at this time she is discharging with a Foley catheter  Consults:  None Procedures:  None   Discharge Diagnoses:   Principal Problem:   Acute metabolic encephalopathy Active Problems:   Advanced dementia (HCC)   Anemia in CKD (chronic kidney disease)   Depression   GERD (gastroesophageal reflux disease)   Hyperlipemia   Acute UTI (urinary tract infection)   Acute urinary retention   Brief hospital course: This is an 89 year old female with Alzheimer's dementia, history of breast cancer, coronary artery disease, macular degeneration, peripheral neuropathy who presented to the hospital for worsening in mental status from her facility.  In the ED, she was found to have a possible UTI and was therefore started on ceftriaxone.   Subjective:  The patient is not as confused today.  She complains of ongoing pain in the right leg.   Assessment and Plan: Principal Problem:   Acute metabolic encephalopathy, UTI? -Has dementia at baseline -Can continue clonazepam and outpatient meds for dementia -It is possible this may be secondary to UTI -Urine culture shows multiple species and was likely contaminated-however, as the patient also has symptoms of urinary retention, we decided to treat this as a UTI -she has completed a 3-day course of ceftriaxone  Active Problems: Urinary retention - The patient required an In-N-Out cath x 2 and was still unable to urinate independently - Foley catheter was placed on 3/13 - She needs an outpatient follow-up with urology which I have discussed with her daughter and granddaughter-they will be helping to arrange this     Advanced dementia (HCC) -Continue Rexulti, clonazepam,  Depakote and Zoloft   Wheelchair-bound - She is able to transition to wheelchair on her own at baseline but now says she is too weak to get out of bed on her own - PT eval ordered and SNF recommendation made   Right knee and right ankle weakness and pain - She has a number of bruises over her right leg and states that she fell on it a few days ago - Imaging reveals osteoporosis and osteoarthritis-no acute fractures             Discharge Instructions  Discharge Instructions     Diet - low sodium heart healthy   Complete by: As directed    Increase activity slowly   Complete by: As directed       Allergies as of 12/25/2023       Reactions   Elemental Sulfur Anaphylaxis   Lortab [hydrocodone-acetaminophen]    Sulfa Antibiotics    Atorvastatin    Macrobid [nitrofurantoin]    Percocet [oxycodone-acetaminophen]    Reclast [zoledronic Acid]    Statins    Zithromax [azithromycin]         Medication List     STOP taking these medications    ciprofloxacin 250 MG tablet Commonly known as: CIPRO       TAKE these medications    acetaminophen 500 MG tablet Commonly known as: TYLENOL Take 1,000 mg by mouth See admin instructions. Take 1000mg  by mouth twice a day, also allowed to take every six hours as needed   ALPRAZolam 0.25 MG tablet Commonly known as: Xanax Take 1 tablet (0.25 mg total) by mouth 3 (three) times daily as needed for anxiety. This is  to be used as needed in between clonazepam doses but only for severe agitation that is not redirectable and also for patient having significant hallucinations, delusions, anxiety especially if the patient is significantly upset or is there is danger to the patient or others.   brexpiprazole 1 MG Tabs tablet Commonly known as: Rexulti Take 1 tablet (1 mg total) by mouth daily.   clonazepam 0.125 MG disintegrating tablet Commonly known as: KLONOPIN Take 1 tablet (0.125 mg total) by mouth 3 (three) times daily.    diclofenac sodium 1 % Gel Commonly known as: VOLTAREN Apply 4 g topically 4 (four) times daily. Rub in to your right knee joint as prescribed What changed: Another medication with the same name was added. Make sure you understand how and when to take each.   diclofenac Sodium 1 % Gel Commonly known as: VOLTAREN Apply 2 g topically 4 (four) times daily. What changed: You were already taking a medication with the same name, and this prescription was added. Make sure you understand how and when to take each.   divalproex 125 MG capsule Commonly known as: DEPAKOTE SPRINKLE Take 1 capsule (125 mg total) by mouth 2 (two) times daily.   esomeprazole 40 MG capsule Commonly known as: NEXIUM Take 40 mg by mouth daily before breakfast.   methocarbamol 500 MG tablet Commonly known as: ROBAXIN Take 1 tablet (500 mg total) by mouth every 8 (eight) hours as needed for muscle spasms.   naproxen sodium 220 MG tablet Commonly known as: ALEVE Take 220 mg by mouth as needed.   sertraline 100 MG tablet Commonly known as: ZOLOFT Take 1 tablet (100 mg total) by mouth daily.   SYSTANE COMPLETE OP Apply 1 drop to eye every 6 (six) hours as needed (Dry eyes).            The results of significant diagnostics from this hospitalization (including imaging, microbiology, ancillary and laboratory) are listed below for reference.    DG KNEE 3 VIEW RIGHT Result Date: 12/24/2023 CLINICAL DATA:  Right knee pain. EXAM: RIGHT KNEE - 3 VIEW COMPARISON:  None Available. FINDINGS: There is diffuse decreased bone mineralization. Moderate to severe medial joint space narrowing and peripheral osteophytosis. Moderate patellofemoral joint space narrowing and mild peripheral osteophytosis. Minimal joint effusion. The lateral compartment joint space is maintained. Posterior proximal tibial degenerative osteophytes and adjacent posterior 14 mm and 11 mm well corticated chronic ossicles. No acute fracture or dislocation.  Mild atherosclerotic calcifications. IMPRESSION: Moderate to severe medial and moderate patellofemoral compartment osteoarthritis. Electronically Signed   By: Neita Garnet M.D.   On: 12/24/2023 14:04   DG Foot Complete Right Result Date: 12/24/2023 CLINICAL DATA:  Foot and ankle pain. EXAM: RIGHT FOOT COMPLETE - 3+ VIEW; RIGHT ANKLE - COMPLETE 3+ VIEW COMPARISON:  Right great toe radiographs 04/12/2012 FINDINGS: Right ankle: There is diffuse decreased bone mineralization. Mild to moderate medial and posterior tibiotalar joint space narrowing. Mild chronic enthesopathic change at the Achilles insertion on the calcaneus. Mild-to-moderate talonavicular and navicular-cuneiform degenerative joint space narrowing. Mild-to-moderate lateral malleolar soft tissue swelling. No acute fracture is seen. No dislocation. Right foot: Moderate hallux valgus. Mild lateral great toe metatarsophalangeal joint space narrowing. Mild-to-moderate first through fifth interphalangeal joint space narrowing. Old healed fracture of the distal shaft of the fifth metatarsal with 3 mm medial displacement of the distal fracture component with respect to the proximal fracture component and 23 degrees lateral apex angulation at the fracture. IMPRESSION: 1. Mild-to-moderate tibiotalar, talonavicular, and navicular-cuneiform  osteoarthritis. 2. Moderate hallux valgus. 3. Old healed fracture of the distal shaft of the fifth metatarsal. Electronically Signed   By: Neita Garnet M.D.   On: 12/24/2023 14:03   DG Ankle Complete Right Result Date: 12/24/2023 CLINICAL DATA:  Foot and ankle pain. EXAM: RIGHT FOOT COMPLETE - 3+ VIEW; RIGHT ANKLE - COMPLETE 3+ VIEW COMPARISON:  Right great toe radiographs 04/12/2012 FINDINGS: Right ankle: There is diffuse decreased bone mineralization. Mild to moderate medial and posterior tibiotalar joint space narrowing. Mild chronic enthesopathic change at the Achilles insertion on the calcaneus. Mild-to-moderate  talonavicular and navicular-cuneiform degenerative joint space narrowing. Mild-to-moderate lateral malleolar soft tissue swelling. No acute fracture is seen. No dislocation. Right foot: Moderate hallux valgus. Mild lateral great toe metatarsophalangeal joint space narrowing. Mild-to-moderate first through fifth interphalangeal joint space narrowing. Old healed fracture of the distal shaft of the fifth metatarsal with 3 mm medial displacement of the distal fracture component with respect to the proximal fracture component and 23 degrees lateral apex angulation at the fracture. IMPRESSION: 1. Mild-to-moderate tibiotalar, talonavicular, and navicular-cuneiform osteoarthritis. 2. Moderate hallux valgus. 3. Old healed fracture of the distal shaft of the fifth metatarsal. Electronically Signed   By: Neita Garnet M.D.   On: 12/24/2023 14:03   CT Renal Stone Study Result Date: 12/22/2023 CLINICAL DATA:  Abdominal/flank pain, stone suspected. Urinary tract infection EXAM: CT ABDOMEN AND PELVIS WITHOUT CONTRAST TECHNIQUE: Multidetector CT imaging of the abdomen and pelvis was performed following the standard protocol without IV contrast. RADIATION DOSE REDUCTION: This exam was performed according to the departmental dose-optimization program which includes automated exposure control, adjustment of the mA and/or kV according to patient size and/or use of iterative reconstruction technique. COMPARISON:  None Available. FINDINGS: Lower chest: No acute abnormality. Mild cardiomegaly. Moderate hernia. Hepatobiliary: Cholelithiasis without superimposed pericholecystic inflammatory change. Liver unremarkable on this noncontrast examination. No intra or extrahepatic biliary ductal dilation. Pancreas: Unremarkable Spleen: Unremarkable Adrenals/Urinary Tract: Adrenal glands are unremarkable. The kidneys are normal in size and position. Multiple bilateral parapelvic cysts are better assessed prior MRI examination of 08/06/2015. No  hydronephrosis. No intrarenal or ureteral calculi. The bladder is unremarkable. Stomach/Bowel: Stomach is within normal limits. Appendix absent. No evidence of bowel wall thickening, distention, or inflammatory changes. Vascular/Lymphatic: Aortic atherosclerosis. No enlarged abdominal or pelvic lymph nodes. Reproductive: Status post hysterectomy. No adnexal masses. Other: No abdominal wall hernia or abnormality. No abdominopelvic ascites. Musculoskeletal: Osseous structures are age-appropriate. No acute bone abnormality. IMPRESSION: 1. No acute intra-abdominal pathology identified. No definite radiographic explanation for the patient's reported symptoms. 2. Cholelithiasis. 3. Multiple other nonacute observations, as described above. Aortic Atherosclerosis (ICD10-I70.0). Electronically Signed   By: Helyn Numbers M.D.   On: 12/22/2023 02:22   DG Chest Portable 1 View Result Date: 12/22/2023 CLINICAL DATA:  Shortness of breath EXAM: PORTABLE CHEST 1 VIEW COMPARISON:  Chest x-ray 12/22/2014 FINDINGS: Heart is enlarged. There central pulmonary vascular congestion. There is a small left pleural effusion patchy airspace opacity in the left costophrenic angle. No pneumothorax or acute fracture. Surgical clips overlie the upper left chest and axilla. IMPRESSION: 1. Cardiomegaly with central pulmonary vascular congestion. 2. Small left pleural effusion with patchy airspace opacity in the left costophrenic angle, likely atelectasis. Electronically Signed   By: Darliss Cheney M.D.   On: 12/22/2023 01:59   CT Head Wo Contrast Result Date: 12/22/2023 CLINICAL DATA:  Altered mental status EXAM: CT HEAD WITHOUT CONTRAST TECHNIQUE: Contiguous axial images were obtained from the base of the skull  through the vertex without intravenous contrast. RADIATION DOSE REDUCTION: This exam was performed according to the departmental dose-optimization program which includes automated exposure control, adjustment of the mA and/or kV  according to patient size and/or use of iterative reconstruction technique. COMPARISON:  12/23/2022 FINDINGS: Brain: No mass,hemorrhage or extra-axial collection. Normal appearance of the parenchyma and CSF spaces. Vascular: No hyperdense vessel or unexpected vascular calcification. Skull: The visualized skull base, calvarium and extracranial soft tissues are normal. Sinuses/Orbits: No fluid levels or advanced mucosal thickening of the visualized paranasal sinuses. No mastoid or middle ear effusion. Normal orbits. Other: None. IMPRESSION: Normal head CT. Electronically Signed   By: Deatra Robinson M.D.   On: 12/22/2023 00:58   Labs:   Basic Metabolic Panel: Recent Labs  Lab 12/21/23 2323 12/22/23 1442  NA 134* 135  K 3.1* 3.7  CL 101 103  CO2 22 22  GLUCOSE 113* 85  BUN 36* 31*  CREATININE 1.08* 0.72  CALCIUM 8.6* 8.5*  MG  --  1.9     CBC: Recent Labs  Lab 12/21/23 2323 12/22/23 1442  WBC 7.1 7.6  NEUTROABS 6.4 5.6  HGB 9.7* 10.2*  HCT 28.1* 29.4*  MCV 82.6 81.7  PLT 96* 85*         SIGNED:   Calvert Cantor, MD  [facility] 12/25/2023, 11:39 AM

## 2023-12-25 NOTE — TOC Transition Note (Signed)
 Transition of Care [facility]) - Discharge Note   Patient Details  Name: Beverly Jacobs MRN: [id number] Date of Birth: October 21, 1933  Transition of Care [facility]) CM/SW Contact:  Otelia Santee, LCSW Phone Number: 12/25/2023, 4:26 PM   Clinical Narrative:    PASRR number assigned. Pt able to transfer to [facility] for SNF today. Pt will be going to room 307. RN to call report to [phone] (ask for 3000 hall if front desk answers). Pt's daughter/HCPOA informed of discharge. DC packet with signed scripts placed at RN station. [facility] called at 4:23pm for transportation.    Final next level of care: Skilled Nursing Facility Barriers to Discharge: Barriers Resolved   Patient Goals and CMS Choice Patient states their goals for this hospitalization and ongoing recovery are:: Return to [facility].gov Compare Post Acute Care list provided to:: Patient Represenative (must comment) Choice offered to / list presented to : [facility] POA / Guardian [place] ownership interest in [facility].provided to:: [facility] POA / Guardian    Discharge Placement PASRR number recieved: 12/25/23            Patient chooses bed at: Other - please specify in the comment section below: ([facility]) Patient to be transferred to facility by: [facility] Name of family member notified: HCPOA Patient and family notified of of transfer: 12/25/23  Discharge Plan and Services Additional resources added to the After Visit Summary for   In-house Referral: NA Discharge Planning Services: CM Consult            DME Arranged: N/A DME Agency: NA       HH Arranged: NA HH Agency: [facility]        Social Drivers of Health (SDOH) Interventions SDOH Screenings   Food Insecurity: Patient Declined (12/23/2023)  Housing: Unknown (12/23/2023)  Transportation Needs: No Transportation Needs (12/23/2023)  Utilities: Not At Risk (12/23/2023)  Financial Resource Strain: Low  Risk  (01/11/2023)   Received from [facility], [facility]  Physical Activity: Unknown (01/11/2023)   Received from [facility], [facility]  Social Connections: Patient Declined (12/23/2023)  Stress: Patient Declined (01/11/2023)   Received from [facility], [facility]  Tobacco Use: Medium Risk (07/30/2023)     Readmission Risk Interventions     No data to display

## 2023-12-25 NOTE — Care Management Important Message (Signed)
 Important Message  Patient Details IM Letter given. Name: Beverly Jacobs MRN: [id number] Date of Birth: 1934/01/08   Important Message Given:  Yes - Medicare IM     Caren Macadam 12/25/2023, 2:42 PM

## 2023-12-25 NOTE — TOC Progression Note (Signed)
 Transition of Care [facility]) - Progression Note    Patient Details  Name: Beverly Jacobs MRN: [id number] Date of Birth: [birth year]  [facility]) CM/SW Contact  Otelia Santee, LCSW Phone Number: 12/25/2023, 3:26 PM  Clinical Narrative:    Family accepted bed offer at [facility] PASRR number requested and is currently at level 2 review. Records have been uploaded for review.  Pt able to transfer to Snf once PASRR complete.    Expected Discharge Plan: Memory Care Barriers to Discharge: Continued Medical Work up  Expected Discharge Plan and Services In-house Referral: NA Discharge Planning Services: CM Consult   Living arrangements for the past 2 months: Assisted Living Facility (Memory Care) Expected Discharge Date: 12/25/23                 DME Agency: NA       HH Arranged: NA HH Agency: [facility] currently active)         Social Determinants of Health (SDOH) Interventions SDOH Screenings   Food Insecurity: Patient Declined (12/23/2023)  Housing: Unknown (12/23/2023)  Transportation Needs: No Transportation Needs (12/23/2023)  Utilities: Not At Risk (12/23/2023)  Financial Resource Strain: Low Risk  (01/11/2023)   Received from [facility], [facility]  Physical Activity: Unknown (01/11/2023)   Received from [facility], [facility]  Social Connections: Patient Declined (12/23/2023)  Stress: Patient Declined (01/11/2023)   Received from [facility], [facility]  Tobacco Use: Medium Risk (07/30/2023)    Readmission Risk Interventions     No data to display

## 2023-12-25 NOTE — TOC PASRR Note (Signed)
 30 Day PASRR Note   Patient Details  Name: Beverly Jacobs Date of Birth: 17-Dec-1933   Transition of Care [facility]) CM/SW Contact:    Jessie Foot, RN Phone Number:[phone] 12/25/2023, 12:39 PM  To Whom It May Concern:  Please be advised that this patient will require a short-term nursing home stay - anticipated 30 days or less for rehabilitation and strengthening.   The plan is for return home.

## 2023-12-25 NOTE — Plan of Care (Addendum)
 VSS. No complaints of pain. Foley in place draining amber/brown urine. No acute events overnight.  Problem: Education: Goal: Knowledge of General Education information will improve Description: Including pain rating scale, medication(s)/side effects and non-pharmacologic comfort measures Outcome: Progressing   Problem: Clinical Measurements: Goal: Ability to maintain clinical measurements within normal limits will improve Outcome: Progressing Goal: Will remain free from infection Outcome: Progressing   Problem: Activity: Goal: Risk for activity intolerance will decrease Outcome: Progressing   Problem: Nutrition: Goal: Adequate nutrition will be maintained Outcome: Progressing   Problem: Elimination: Goal: Will not experience complications related to urinary retention Outcome: Progressing   Problem: Pain Managment: Goal: General experience of comfort will improve and/or be controlled Outcome: Progressing   Problem: Safety: Goal: Ability to remain free from injury will improve Outcome: Progressing   Problem: Skin Integrity: Goal: Risk for impaired skin integrity will decrease Outcome: Progressing

## 2024-01-06 ENCOUNTER — Telehealth (INDEPENDENT_AMBULATORY_CARE_PROVIDER_SITE_OTHER): Payer: Medicare Other | Admitting: Neurology

## 2024-01-06 DIAGNOSIS — F02C3 Dementia in other diseases classified elsewhere, severe, with mood disturbance: Secondary | ICD-10-CM

## 2024-01-06 DIAGNOSIS — F060 Psychotic disorder with hallucinations due to known physiological condition: Secondary | ICD-10-CM

## 2024-01-06 DIAGNOSIS — G301 Alzheimer's disease with late onset: Secondary | ICD-10-CM

## 2024-01-06 DIAGNOSIS — F0392 Unspecified dementia, unspecified severity, with psychotic disturbance: Secondary | ICD-10-CM

## 2024-01-06 DIAGNOSIS — F03911 Unspecified dementia, unspecified severity, with agitation: Secondary | ICD-10-CM
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
## 2024-01-08 ENCOUNTER — Encounter: Payer: Self-pay | Admitting: Neurology

## 2024-01-10 MED ORDER — RISPERIDONE 0.25 MG PO TABS: ORAL_TABLET | ORAL | 11 refills | Status: AC

## 2024-01-10 MED ORDER — CLONAZEPAM 0.125 MG PO TBDP: ORAL_TABLET | ORAL | 4 refills | Status: AC

## 2024-01-12 NOTE — Telephone Encounter (Signed)
 Spoke with Rebekah at [facility]. She said the pharmacy sent her a copy of the prescriptions. She doesn't need a separate medication list from [place] right now.

## 2024-01-21 ENCOUNTER — Telehealth: Payer: Self-pay | Admitting: Neurology

## 2024-01-21 NOTE — Telephone Encounter (Signed)
 I called Ladona Ridgel, Palliative health practioner.  Pt was seen yesterday on 01-20-2024.  Pt tearful, still with hallucinations, also new symptom involuntary muscle movements (jerky) bilaterally since last seen. New per family.  Not tardive dyskinesia.  No other new medications.  ? Questionable if all med changes.  ? To change depakote 250mg  bid since LE muscle movements.  Please advise.

## 2024-01-21 NOTE — Telephone Encounter (Signed)
 Taylor/Pallitive Care Practioner  is asking for a call from either Dr Lucia Gaskins or a RN, she states it is not urgent but she would like a call at [phone]

## 2024-01-25 NOTE — Telephone Encounter (Signed)
 I did contact her ba last week ty

## 2024-03-16 ENCOUNTER — Emergency Department (HOSPITAL_COMMUNITY)
Admission: EM | Admit: 2024-03-16 | Discharge: 2024-03-16 | Disposition: A | Discharge status: Home / Self Care | Attending: Emergency Medicine | Admitting: Emergency Medicine

## 2024-03-16 ENCOUNTER — Other Ambulatory Visit: Payer: Self-pay

## 2024-03-16 ENCOUNTER — Encounter (HOSPITAL_COMMUNITY): Payer: Self-pay

## 2024-03-16 DIAGNOSIS — N3000 Acute cystitis without hematuria: Secondary | ICD-10-CM | POA: Insufficient documentation

## 2024-03-16 DIAGNOSIS — Z853 Personal history of malignant neoplasm of breast: Secondary | ICD-10-CM | POA: Diagnosis not present

## 2024-03-16 DIAGNOSIS — R4689 Other symptoms and signs involving appearance and behavior: Secondary | ICD-10-CM | POA: Insufficient documentation

## 2024-03-16 DIAGNOSIS — F0390 Unspecified dementia without behavioral disturbance: Secondary | ICD-10-CM | POA: Diagnosis not present

## 2024-03-16 DIAGNOSIS — R4182 Altered mental status, unspecified: Secondary | ICD-10-CM | POA: Diagnosis present

## 2024-03-16 DIAGNOSIS — I2510 Atherosclerotic heart disease of native coronary artery without angina pectoris: Secondary | ICD-10-CM | POA: Diagnosis not present

## 2024-03-16 LAB — COMPREHENSIVE METABOLIC PANEL WITH GFR
ALT: 11 U/L (ref 0–44)
AST: 12 U/L — ABNORMAL LOW (ref 15–41)
Albumin: 3.9 g/dL (ref 3.5–5.0)
Alkaline Phosphatase: 64 U/L (ref 38–126)
Anion gap: 10 (ref 5–15)
BUN: 23 mg/dL (ref 8–23)
CO2: 23 mmol/L (ref 22–32)
Calcium: 8.7 mg/dL — ABNORMAL LOW (ref 8.9–10.3)
Chloride: 103 mmol/L (ref 98–111)
Creatinine, Ser: 0.75 mg/dL (ref 0.44–1.00)
GFR, Estimated: >60 mL/min (ref >60)
Glucose, Bld: 92 mg/dL (ref 70–99)
Potassium: 3.9 mmol/L (ref 3.5–5.1)
Sodium: 136 mmol/L (ref 135–145)
Total Bilirubin: 0.8 mg/dL (ref 0.0–1.2)
Total Protein: 6.8 g/dL (ref 6.5–8.1)

## 2024-03-16 LAB — CBC WITH DIFFERENTIAL/PLATELET
Abs Immature Granulocytes: 0.01 10*3/uL (ref 0.00–0.07)
Basophils Absolute: 0 10*3/uL (ref 0.0–0.1)
Basophils Relative: 0 %
Eosinophils Absolute: 0.1 10*3/uL (ref 0.0–0.5)
Eosinophils Relative: 3 %
HCT: 27.5 % — ABNORMAL LOW (ref 36.0–46.0)
Hemoglobin: 9.3 g/dL — ABNORMAL LOW (ref 12.0–15.0)
Immature Granulocytes: 0 %
Lymphocytes Relative: 21 %
Lymphs Abs: 0.7 10*3/uL (ref 0.7–4.0)
MCH: 28.4 pg (ref 26.0–34.0)
MCHC: 33.8 g/dL (ref 30.0–36.0)
MCV: 83.8 fL (ref 80.0–100.0)
Monocytes Absolute: 0.3 10*3/uL (ref 0.1–1.0)
Monocytes Relative: 9 %
Neutro Abs: 2.3 10*3/uL (ref 1.7–7.7)
Neutrophils Relative %: 67 %
Platelets: 180 10*3/uL (ref 150–400)
RBC: 3.28 MIL/uL — ABNORMAL LOW (ref 3.87–5.11)
RDW: 14.1 % (ref 11.5–15.5)
WBC: 3.5 10*3/uL — ABNORMAL LOW (ref 4.0–10.5)
nRBC: 0 % (ref 0.0–0.2)

## 2024-03-16 LAB — URINALYSIS, W/ REFLEX TO CULTURE (INFECTION SUSPECTED)
Bilirubin Urine: NEGATIVE
Glucose, UA: NEGATIVE mg/dL
Hgb urine dipstick: NEGATIVE
Ketones, ur: 5 mg/dL — AB
Nitrite: NEGATIVE
Protein, ur: NEGATIVE mg/dL
Specific Gravity, Urine: 1.028 (ref 1.005–1.030)
pH: 5 (ref 5.0–8.0)

## 2024-03-16 LAB — CBG MONITORING, ED: Glucose-Capillary: 80 mg/dL (ref 70–99)

## 2024-03-16 MED ORDER — CEPHALEXIN 500 MG PO CAPS: 500.0000 mg | ORAL_CAPSULE | Freq: Four times a day (QID) | ORAL | 0 refills | Status: AC

## 2024-03-16 MED ORDER — SODIUM CHLORIDE 0.9 % IV SOLN
1.0000 g | Freq: Once | INTRAVENOUS | Status: AC
  Administered 2024-03-16: 1 g via INTRAVENOUS
  Filled 2024-03-16: qty 10

## 2024-03-16 MED ORDER — CLONAZEPAM 0.125 MG PO TBDP
0.1250 mg | ORAL_TABLET | Freq: Once | ORAL | Status: AC
  Administered 2024-03-16: 0.125 mg via ORAL
  Filled 2024-03-16: qty 1

## 2024-03-16 NOTE — Discharge Instructions (Addendum)
 Thank you for coming to [facility] Emergency Department. You were seen for behavioral change. We did an exam, labs, and imaging, and these showed +urinary tract infection. Please take keflex four times per day for 5 days.  Please follow up with your primary care provider within 1 week.   Do not hesitate to return to the ED or call 911 if you experience: -Worsening symptoms -Lightheadedness, passing out -Fevers/chills -Anything else that concerns you

## 2024-03-16 NOTE — ED Provider Notes (Signed)
 [place] EMERGENCY DEPARTMENT AT [facility] Provider Note   CSN: [id number] Arrival date & time: 03/16/24  1335     History  Chief Complaint  Patient presents with   Altered Mental Status    Beverly Jacobs is a 89 y.o. female with PMH as listed below who presents with [facility] for AMS. Pt denies any c/o, staff does not have any other c/o except AMS.  Per the daughter at bedside, facility noted that patient was acting "erratically."  She does have a history of dementia but in the past she has acted this way, very agitated and angry, when she has had a UTI.  Patient does have a long history of recurrent UTIs and possible interstitial cystitis diagnosis as well.  Patient is A&O x 2 at bedside her daughter states that this is within normal limits for her.  Patient states she does not know why she is here and has no complaints.  She denies any fever/chills, chest pain, shortness of breath, falls, head trauma, abdominal pain, nausea vomiting diarrhea constipation, urinary symptoms.   Past Medical History:  Diagnosis Date   Cancer [facility])    breast   Coronary artery disease    GERD (gastroesophageal reflux disease)    Hyperlipidemia with target low density lipoprotein (LDL) cholesterol less than 100 mg/dL    Knee pain, left    Neuropathy        Home Medications Prior to Admission medications   Medication Sig Start Date End Date Taking? Authorizing Provider  acetaminophen  (TYLENOL ) 500 MG tablet Take 1,000 mg by mouth See admin instructions. Take 1000mg  by mouth twice a day, also allowed to take every six hours as needed    [provider]  ALPRAZolam  (XANAX ) 0.25 MG tablet Take 1 tablet (0.25 mg total) by mouth 3 (three) times daily as needed for anxiety. This is to be used as needed in between clonazepam  doses but only for severe agitation that is not redirectable and also for patient having significant hallucinations, delusions, anxiety especially if  the patient is significantly upset or is there is danger to the patient or others. 12/25/23   Rizwan, Saima, MD  clonazepam  (KLONOPIN ) 0.125 MG disintegrating tablet Take one tablet twice daily (.125mg ) and 2 tablets at bedtime (0.5mg ). 01/10/24   Glory Larsen, MD  diclofenac  Sodium (VOLTAREN ) 1 % GEL Apply 2 g topically 4 (four) times daily. 12/25/23   Rizwan, Saima, MD  divalproex  (DEPAKOTE  SPRINKLE) 125 MG capsule Take 1 capsule (125 mg total) by mouth 2 (two) times daily. 12/07/23   Glory Larsen, MD  methocarbamol  (ROBAXIN ) 500 MG tablet Take 1 tablet (500 mg total) by mouth every 8 (eight) hours as needed for muscle spasms. 12/25/23   Sedalia Dacosta, MD  Propylene Glycol (SYSTANE COMPLETE OP) Apply 1 drop to eye every 6 (six) hours as needed (Dry eyes).    [provider]  risperiDONE  (RISPERDAL ) 0.25 MG tablet 0.25mg  once daily at 330pm 01/10/24   Glory Larsen, MD  sertraline  (ZOLOFT ) 100 MG tablet Take 1 tablet (100 mg total) by mouth daily. 12/02/23   Glory Larsen, MD      Allergies    Elemental sulfur, Lortab [hydrocodone-acetaminophen ], Sulfa antibiotics, Atorvastatin, Macrobid [nitrofurantoin], Percocet [oxycodone -acetaminophen ], Reclast [zoledronic acid], Statins, and Zithromax [azithromycin]    Review of Systems   Review of Systems A 10 point review of systems was performed and is negative unless otherwise reported in HPI.  Physical Exam Updated  Vital Signs BP (!) 149/64   Pulse 64   Temp 97.7 F (36.5 C)   Resp 16   SpO2 100%  Physical Exam General: Normal appearing elderly female, lying in bed.  HEENT: NCAT, PERRLA, Sclera anicteric, MMM, trachea midline. No midline C-spine TTP, deformities, or stepoffs.  Cardiology: RRR, no murmurs/rubs/gallops. Resp: Normal respiratory rate and effort. CTAB, no wheezes, rhonchi, crackles.  Abd: Soft, non-tender, non-distended. No rebound tenderness or guarding.  GU: Deferred. MSK: No peripheral edema or signs of  trauma. Extremities without deformity or TTP. No cyanosis or clubbing. Skin: warm, dry.  Back: No CVA tenderness Neuro: A&Ox2 (self and place, not year or situation), CNs II-XII grossly intact. MAEs. Sensation grossly intact.  Psych: Normal mood and affect.   ED Results / Procedures / Treatments   Labs (all labs ordered are listed, but only abnormal results are displayed) Labs Reviewed  CBC WITH DIFFERENTIAL/PLATELET - Abnormal; Notable for the following components:      Result Value   WBC 3.5 (*)    RBC 3.28 (*)    Hemoglobin 9.3 (*)    HCT 27.5 (*)    All other components within normal limits  COMPREHENSIVE METABOLIC PANEL WITH GFR - Abnormal; Notable for the following components:   Calcium 8.7 (*)    AST 12 (*)    All other components within normal limits  URINALYSIS, W/ REFLEX TO CULTURE (INFECTION SUSPECTED)  CBG MONITORING, ED    EKG EKG Interpretation Date/Time:  Wednesday March 16 2024 14:49:28 EDT Ventricular Rate:  64 PR Interval:  168 QRS Duration:  95 QT Interval:  417 QTC Calculation: 431 R Axis:   91  Text Interpretation: Sinus rhythm Borderline T abnormalities, anterolateral leads Confirmed by Annita Kindle [phone]) on 03/16/2024 3:03:44 PM  Radiology No results found.  Procedures Procedures    Medications Ordered in ED Medications  cefTRIAXone  (ROCEPHIN ) 1 g in sodium chloride  0.9 % 100 mL IVPB (0 g Intravenous Stopped 03/16/24 1732)  clonazepam  (KLONOPIN ) disintegrating tablet 0.125 mg (0.125 mg Oral Given 03/16/24 1708)    ED Course/ Medical Decision Making/ A&P                          Medical Decision Making Amount and/or Complexity of Data Reviewed Labs: ordered. Decision-making details documented in ED Course.  Risk Prescription drug management.    This patient presents to the ED for concern of acting erratically, this involves an extensive number of treatment options, and is a complaint that carries with it a high risk of complications and  morbidity.  I considered the following differential and admission for this acute, potentially life threatening condition.   MDM:    Ddx of acute altered mental status or encephalopathy considered but not limited to: -No headache, head trauma, or FNDs to indicate intracranial cause -Infection such as UTI - UA +UTI. No sxs of PNA or meningitis.  -Toxic ingestion such as polypharmacy - reports no recent medication changes -No electrolyte abnormalities or hyper/hypoglycemia -No signs of ACS or arrhythmia on EKG and no CP reported   Clinical Course as of 03/21/24 2053  Wed Mar 16, 2024  1503 Hemoglobin(!): 9.3 BL 9-10.5 [HN]  1503 Glucose-Capillary: 80 wnl [HN]  1503 Comprehensive metabolic panel(!) Unremarkable in the context of this patient's presentation  [HN]  1658 Urinalysis, w/ Reflex to Culture (Infection Suspected) -Urine, Clean Catch(!) +UTI. No significant culture data. Will give ceftriaxone /keflex  rx. Pt well-appearing, afebrile, doubt sepsis.  Patient requesting home klonopin . Asking also to be discharged back to facility. Discussed and shared decision-making with family and patient who agree. DC w/ discharge instructions/return precautions. All questions answered to patient's satisfaction.   [HN]    Clinical Course User Index [HN] Merdis Stalling, MD    Labs: I Ordered, and personally interpreted labs.  The pertinent results include: those listed above  Additional history obtained from chart review, daughter/son at bedside.   Cardiac Monitoring: The patient was maintained on a cardiac monitor.  I personally viewed and interpreted the cardiac monitored which showed an underlying rhythm of: NSR  Reevaluation: After the interventions noted above, I reevaluated the patient and found that they have :improved  Social Determinants of Health: Lives at facility  Disposition:  DC w/ discharge instructions/return precautions. All questions answered to patient's satisfaction.     Co morbidities that complicate the patient evaluation  Past Medical History:  Diagnosis Date   Cancer (HCC)    breast   Coronary artery disease    GERD (gastroesophageal reflux disease)    Hyperlipidemia with target low density lipoprotein (LDL) cholesterol less than 100 mg/dL    Knee pain, left    Neuropathy      Medicines No orders of the defined types were placed in this encounter.   I have reviewed the patients home medicines and have made adjustments as needed  Problem List / ED Course: Problem List Items Addressed This Visit   None Visit Diagnoses       Behavioral change    -  Primary     Acute cystitis without hematuria                       This note was created using dictation software, which may contain spelling or grammatical errors.    Merdis Stalling, MD 03/21/24 2055

## 2024-03-16 NOTE — ED Triage Notes (Signed)
 EMS reports from [facility] for AMS. Pt denies any c/o, staff does not have any other c/o except AMS.  BP 146/70 HR 69 RR 18 Sp02 95 RA CBG 95

## 2024-04-05 ENCOUNTER — Other Ambulatory Visit: Payer: Self-pay

## 2024-04-05 ENCOUNTER — Emergency Department (HOSPITAL_COMMUNITY)

## 2024-04-05 ENCOUNTER — Emergency Department (HOSPITAL_COMMUNITY)
Admission: EM | Admit: 2024-04-05 | Discharge: 2024-04-05 | Disposition: A | Discharge status: Home / Self Care | Attending: Emergency Medicine | Admitting: Emergency Medicine

## 2024-04-05 ENCOUNTER — Encounter (HOSPITAL_COMMUNITY): Payer: Self-pay

## 2024-04-05 DIAGNOSIS — F0390 Unspecified dementia without behavioral disturbance: Secondary | ICD-10-CM | POA: Insufficient documentation

## 2024-04-05 DIAGNOSIS — M25512 Pain in left shoulder: Secondary | ICD-10-CM | POA: Diagnosis present

## 2024-04-05 DIAGNOSIS — W19XXXA Unspecified fall, initial encounter: Secondary | ICD-10-CM

## 2024-04-05 MED ORDER — ACETAMINOPHEN 500 MG PO TABS
1000.0000 mg | ORAL_TABLET | Freq: Once | ORAL | Status: AC
  Administered 2024-04-05: 1000 mg via ORAL
  Filled 2024-04-05: qty 2

## 2024-04-05 MED ORDER — LIDOCAINE 5 % EX PTCH
1.0000 | MEDICATED_PATCH | Freq: Once | CUTANEOUS | Status: DC
  Administered 2024-04-05: 1 via TRANSDERMAL
  Filled 2024-04-05: qty 1

## 2024-04-05 NOTE — ED Provider Notes (Signed)
 [place] EMERGENCY DEPARTMENT AT [facility] Provider Note   CSN: [id number] Arrival date & time: 04/05/24  1052     Patient presents with: Beverly Jacobs is a 89 y.o. female.   Patient is an 89 year old female with a past medical history of dementia presenting to the emergency department after a fall.  Per EMS, the fall was not witnessed by staff or other members at the nursing home saw her in her wheelchair reaching out for the door handle when she fell forward out of her wheelchair.  Patient denies any dizziness.  She is complaining of pain in her left shoulder.  She is here with her daughter who reports that the nursing home staff states that she has been acting like her normal self.  The history is provided by the patient and a relative. The history is limited by the condition of the patient (level 5 caveat for dementia).  Fall       Prior to Admission medications   Medication Sig Start Date End Date Taking? Authorizing Provider  acetaminophen  (TYLENOL ) 500 MG tablet Take 1,000 mg by mouth See admin instructions. Take 1000mg  by mouth twice a day, also allowed to take every six hours as needed    [provider]  ALPRAZolam  (XANAX ) 0.25 MG tablet Take 1 tablet (0.25 mg total) by mouth 3 (three) times daily as needed for anxiety. This is to be used as needed in between clonazepam  doses but only for severe agitation that is not redirectable and also for patient having significant hallucinations, delusions, anxiety especially if the patient is significantly upset or is there is danger to the patient or others. 12/25/23   Rizwan, Saima, MD  cephALEXin  (KEFLEX ) 500 MG capsule Take 1 capsule (500 mg total) by mouth 4 (four) times daily. 03/16/24   Franklyn Sid SAILOR, MD  clonazepam  (KLONOPIN ) 0.125 MG disintegrating tablet Take one tablet twice daily (.125mg ) and 2 tablets at bedtime (0.5mg ). 01/10/24   Ines Onetha NOVAK, MD  diclofenac  Sodium (VOLTAREN ) 1 % GEL Apply 2  g topically 4 (four) times daily. 12/25/23   Rizwan, Saima, MD  divalproex  (DEPAKOTE  SPRINKLE) 125 MG capsule Take 1 capsule (125 mg total) by mouth 2 (two) times daily. 12/07/23   Ines Onetha NOVAK, MD  methocarbamol  (ROBAXIN ) 500 MG tablet Take 1 tablet (500 mg total) by mouth every 8 (eight) hours as needed for muscle spasms. 12/25/23   Earley Saucer, MD  Propylene Glycol (SYSTANE COMPLETE OP) Apply 1 drop to eye every 6 (six) hours as needed (Dry eyes).    [provider]  risperiDONE  (RISPERDAL ) 0.25 MG tablet 0.25mg  once daily at 330pm 01/10/24   Ines Onetha NOVAK, MD  sertraline  (ZOLOFT ) 100 MG tablet Take 1 tablet (100 mg total) by mouth daily. 12/02/23   Ines Onetha NOVAK, MD    Allergies: Elemental sulfur, Lortab [hydrocodone-acetaminophen ], Sulfa antibiotics, Atorvastatin, Macrobid [nitrofurantoin], Percocet [oxycodone -acetaminophen ], Reclast [zoledronic acid], Statins, and Zithromax [azithromycin]    Review of Systems  Updated Vital Signs BP 128/64   Pulse 64   Temp 97.6 F (36.4 C) (Oral)   Resp 18   Ht 5' 6 (1.676 m)   Wt 73.4 kg   SpO2 99%   BMI 26.12 kg/m   Physical Exam Vitals and nursing note reviewed.  Constitutional:      General: She is not in acute distress.    Appearance: Normal appearance.  HENT:     Head: Normocephalic and atraumatic.  Nose: Nose normal.     Mouth/Throat:     Mouth: Mucous membranes are moist.   Eyes:     Extraocular Movements: Extraocular movements intact.     Conjunctiva/sclera: Conjunctivae normal.   Neck:     Comments: No midline neck tenderness Cardiovascular:     Rate and Rhythm: Normal rate and regular rhythm.     Heart sounds: Normal heart sounds.  Pulmonary:     Effort: Pulmonary effort is normal.     Breath sounds: Normal breath sounds.  Abdominal:     General: Abdomen is flat.     Palpations: Abdomen is soft.     Tenderness: There is no abdominal tenderness.   Musculoskeletal:     Cervical back: Normal  range of motion and neck supple.     Comments: No midline back tenderness Tenderness to palpation of left lateral shoulder, no tenderness to left elbow or wrist, no tenderness to right upper extremity or bilateral lower extremities Pelvis stable, nontender   Skin:    General: Skin is warm and dry.   Neurological:     General: No focal deficit present.     Mental Status: She is alert. Mental status is at baseline.   Psychiatric:        Mood and Affect: Mood normal.        Behavior: Behavior normal.     (all labs ordered are listed, but only abnormal results are displayed) Labs Reviewed - No data to display  EKG: None  Radiology: DG Chest 1 View Result Date: 04/05/2024 CLINICAL DATA:  Pain after fall EXAM: CHEST  1 VIEW portable semi upright COMPARISON:  Chest x-ray 12/22/2023 FINDINGS: Enlarged cardiopericardial silhouette with calcified and tortuous aorta. No pneumothorax or effusion. Mild lung base opacities are identified. Infiltrates possible. Mild peribronchial thickening. Prominence of the central vasculature. Multiple surgical clips overlie the left upper hemithorax. IMPRESSION: Enlarged heart with vascular congestion. Bilateral lung base parenchymal opacities. Infiltrate is possible. Recommend follow-up. Electronically Signed   By: Ranell Bring M.D.   On: 04/05/2024 12:50   CT Head Wo Contrast Result Date: 04/05/2024 CLINICAL DATA:  Unwitnessed fall EXAM: CT HEAD WITHOUT CONTRAST CT CERVICAL SPINE WITHOUT CONTRAST TECHNIQUE: Multidetector CT imaging of the head and cervical spine was performed following the standard protocol without intravenous contrast. Multiplanar CT image reconstructions of the cervical spine were also generated. RADIATION DOSE REDUCTION: This exam was performed according to the departmental dose-optimization program which includes automated exposure control, adjustment of the mA and/or kV according to patient size and/or use of iterative reconstruction  technique. COMPARISON:  12/21/2023 FINDINGS: CT HEAD FINDINGS Brain: No evidence of acute infarction, hemorrhage, hydrocephalus, extra-axial collection or mass lesion/mass effect. Mild periventricular white matter hypodensity. Vascular: No hyperdense vessel or unexpected calcification. Skull: Normal. Negative for fracture or focal lesion. Sinuses/Orbits: No acute finding. Chronic mucosal thickening and mucosal retention cysts of the maxillary sinuses. Other: None. CT CERVICAL SPINE FINDINGS Alignment: Normal. Skull base and vertebrae: No acute fracture. No primary bone lesion or focal pathologic process. Soft tissues and spinal canal: No prevertebral fluid or swelling. No visible canal hematoma. Disc levels: Mild-to-moderate multilevel cervical disc degenerative disease, worst at C6-C7 Upper chest: Metallic clips throughout the left chest and axilla. Other: None. IMPRESSION: 1. No acute intracranial pathology. 2. No fracture or static subluxation of the cervical spine. 3. Mild-to-moderate multilevel cervical disc degenerative disease, worst at C6-C7. Electronically Signed   By: Marolyn JONETTA Jaksch M.D.   On: 04/05/2024 12:42  CT Cervical Spine Wo Contrast Result Date: 04/05/2024 CLINICAL DATA:  Unwitnessed fall EXAM: CT HEAD WITHOUT CONTRAST CT CERVICAL SPINE WITHOUT CONTRAST TECHNIQUE: Multidetector CT imaging of the head and cervical spine was performed following the standard protocol without intravenous contrast. Multiplanar CT image reconstructions of the cervical spine were also generated. RADIATION DOSE REDUCTION: This exam was performed according to the departmental dose-optimization program which includes automated exposure control, adjustment of the mA and/or kV according to patient size and/or use of iterative reconstruction technique. COMPARISON:  12/21/2023 FINDINGS: CT HEAD FINDINGS Brain: No evidence of acute infarction, hemorrhage, hydrocephalus, extra-axial collection or mass lesion/mass effect. Mild  periventricular white matter hypodensity. Vascular: No hyperdense vessel or unexpected calcification. Skull: Normal. Negative for fracture or focal lesion. Sinuses/Orbits: No acute finding. Chronic mucosal thickening and mucosal retention cysts of the maxillary sinuses. Other: None. CT CERVICAL SPINE FINDINGS Alignment: Normal. Skull base and vertebrae: No acute fracture. No primary bone lesion or focal pathologic process. Soft tissues and spinal canal: No prevertebral fluid or swelling. No visible canal hematoma. Disc levels: Mild-to-moderate multilevel cervical disc degenerative disease, worst at C6-C7 Upper chest: Metallic clips throughout the left chest and axilla. Other: None. IMPRESSION: 1. No acute intracranial pathology. 2. No fracture or static subluxation of the cervical spine. 3. Mild-to-moderate multilevel cervical disc degenerative disease, worst at C6-C7. Electronically Signed   By: Marolyn JONETTA Jaksch M.D.   On: 04/05/2024 12:42   DG Shoulder Left Result Date: 04/05/2024 CLINICAL DATA:  Pain after fall EXAM: LEFT SHOULDER - 3 VIEW COMPARISON:  None Available. FINDINGS: Osteopenia. No fracture or dislocation. Preserved glenohumeral and AC joint spaces. There is a sclerotic focus overlying the humeral head. This appears to be present in 2016 on chest x-ray but is smaller. This could be within the adjacent soft tissues or the humeral head itself. IMPRESSION: No acute osseous abnormality. Osteopenia. Sclerotic focus identified overlying the humeral head on the frontal view. This could be within the humeral head versus the adjacent soft tissues. Electronically Signed   By: Ranell Bring M.D.   On: 04/05/2024 12:33     Procedures   Medications Ordered in the ED  acetaminophen  (TYLENOL ) tablet 1,000 mg (has no administration in time range)  lidocaine (LIDODERM) 5 % 1-3 patch (has no administration in time range)                                    Medical Decision Making This patient presents to  the ED with chief complaint(s) of fall with pertinent past medical history of dementia which further complicates the presenting complaint. The complaint involves an extensive differential diagnosis and also carries with it a high risk of complications and morbidity.    The differential diagnosis includes ICH, mass effect, cervical spine fracture, shoulder fracture, no other traumatic injury seen on exam, no presyncopal symptoms a syncopal fall unlikely  Additional history obtained: Additional history obtained from family and EMS  Records reviewed N/A  ED Course and Reassessment: On patient's arrival she was hemodynamically stable in no acute distress.  Was initially evaluated in triage and had head CT, C-spine, chest x-ray and left shoulder x-ray performed.  Patient's imaging showed no acute traumatic injury.  C-collar was cleared by myself.  She was given Tylenol  and lidocaine patch for pain control.  She is stable for discharge home with outpatient follow-up and was given strict return precautions.  Independent labs interpretation:  The following labs were independently interpreted: N/A  Independent visualization of imaging: - I independently visualized the following imaging with scope of interpretation limited to determining acute life threatening conditions related to emergency care: CTH/C-spine, L shoulder XR, CXR, which revealed no acute traumatic injury   Consultation: - Consulted or discussed management/test interpretation w/ external professional: N/A  Consideration for admission or further workup: Patient has no emergent conditions requiring admission or further work-up at this time and is stable for discharge home with primary care follow-up  Social Determinants of health: N/A    Risk OTC drugs. Prescription drug management.       Final diagnoses:  Fall, initial encounter  Acute pain of left shoulder    ED Discharge Orders     None          Ellouise Richerd POUR, DO 04/05/24 1412

## 2024-04-05 NOTE — ED Notes (Signed)
 Patient to xray.

## 2024-04-05 NOTE — ED Provider Triage Note (Signed)
 Emergency Medicine Provider Triage Evaluation Note  Beverly Jacobs , a 89 y.o. female  was evaluated in triage.  Pt complains of unwitnessed fall.  Complaining of pain in neck wants the collar off.  Does not recall the fall..  Review of Systems  Positive: Neck pain Negative: Abdominal pain  Physical Exam  BP 128/64   Pulse 64   Temp 97.6 F (36.4 C) (Oral)   Resp 18   Ht 5' 6 (1.676 m)   Wt 73.4 kg   SpO2 99%   BMI 26.12 kg/m  Gen:   Awake, no distress   Resp:  Normal effort  MSK:   Moves extremities without difficulty  Other:    Medical Decision Making  Medically screening exam initiated at 11:10 AM.  Appropriate orders placed.  Beverly Jacobs was informed that the remainder of the evaluation will be completed by another provider, this initial triage assessment does not replace that evaluation, and the importance of remaining in the ED until their evaluation is complete.     Beverly Ozell BROCKS, MD 04/05/24 [phone]

## 2024-04-05 NOTE — ED Triage Notes (Signed)
 Pt to ED via [facility] from [facility] unit for fall. Fall was unwitnessed but other patients noted pt on floor immediately. Pt was reaching for a doorknob while sitting in wheelchair and they think she fell forward out of wheelchair. Pt c/o pain in neck, head, face, ankle, chest, and back. Pt is alert to self, place and situation on arrival, disoriented to time. Pt has c-collar on, does not take blood thinners.   EMS VS  134/68 HR 66 96% RA

## 2024-04-05 NOTE — Discharge Instructions (Signed)
 You were seen in the emergency department after your fall.  Your x-rays and CT scan showed no broken bones or internal bleeding.  You likely bruised your shoulder.  You can continue to ice it and take Tylenol  every 6 hours as needed for pain.  You should follow-up with your primary doctor in the next few days to have your symptoms rechecked.  You should return to the emergency department for significantly worsening pain, numbness or weakness in your arm or any other new or concerning symptoms.

## 2024-04-09 ENCOUNTER — Emergency Department (HOSPITAL_COMMUNITY)

## 2024-04-09 ENCOUNTER — Encounter (HOSPITAL_COMMUNITY): Payer: Self-pay

## 2024-04-09 ENCOUNTER — Emergency Department (HOSPITAL_COMMUNITY)
Admission: EM | Admit: 2024-04-09 | Discharge: 2024-04-10 | Disposition: A | Discharge status: Home / Self Care | Attending: Emergency Medicine | Admitting: Emergency Medicine

## 2024-04-09 DIAGNOSIS — W06XXXA Fall from bed, initial encounter: Secondary | ICD-10-CM | POA: Insufficient documentation

## 2024-04-09 DIAGNOSIS — F0390 Unspecified dementia without behavioral disturbance: Secondary | ICD-10-CM | POA: Diagnosis not present

## 2024-04-09 DIAGNOSIS — R519 Headache, unspecified: Secondary | ICD-10-CM | POA: Diagnosis not present

## 2024-04-09 DIAGNOSIS — M25512 Pain in left shoulder: Secondary | ICD-10-CM | POA: Diagnosis present

## 2024-04-09 DIAGNOSIS — W19XXXA Unspecified fall, initial encounter: Secondary | ICD-10-CM

## 2024-04-09 NOTE — ED Triage Notes (Addendum)
 Pt BIB from [facility] for a fall from bed. EMS states there were a lot of pillows on the floor around her bed where she fell. Appears pt rolled out of bed. Pt is c/o left shoulder & back of head pain. No visible deformities or blood observed. C-collar in place by EMS BP 122/68 HR 80 RR 16 Pt denies any pain at time of triage.

## 2024-04-10 ENCOUNTER — Emergency Department (HOSPITAL_COMMUNITY)

## 2024-04-10 DIAGNOSIS — M25512 Pain in left shoulder: Secondary | ICD-10-CM | POA: Diagnosis not present

## 2024-04-10 NOTE — ED Notes (Signed)
 [facility] called

## 2024-04-10 NOTE — ED Provider Notes (Signed)
 [place] EMERGENCY DEPARTMENT AT [facility] Provider Note   CSN: [id number] Arrival date & time: 04/09/24  2242     Patient presents with: Beverly Jacobs is a 89 y.o. female.   The history is provided by the patient and the EMS personnel. The history is limited by the condition of the patient (level 5 dementia).  Fall This is a new problem. The current episode started less than 1 hour ago. The problem occurs constantly. The problem has been resolved. Pertinent negatives include no chest pain, no abdominal pain, no headaches and no shortness of breath. Nothing aggravates the symptoms. Nothing relieves the symptoms. She has tried nothing for the symptoms. The treatment provided no relief.       Prior to Admission medications   Medication Sig Start Date End Date Taking? Authorizing Provider  acetaminophen  (TYLENOL ) 500 MG tablet Take 1,000 mg by mouth See admin instructions. Take 1000mg  by mouth twice a day, also allowed to take every six hours as needed    [provider]  ALPRAZolam  (XANAX ) 0.25 MG tablet Take 1 tablet (0.25 mg total) by mouth 3 (three) times daily as needed for anxiety. This is to be used as needed in between clonazepam  doses but only for severe agitation that is not redirectable and also for patient having significant hallucinations, delusions, anxiety especially if the patient is significantly upset or is there is danger to the patient or others. 12/25/23   Rizwan, Saima, MD  cephALEXin  (KEFLEX ) 500 MG capsule Take 1 capsule (500 mg total) by mouth 4 (four) times daily. 03/16/24   Franklyn Sid SAILOR, MD  clonazepam  (KLONOPIN ) 0.125 MG disintegrating tablet Take one tablet twice daily (.125mg ) and 2 tablets at bedtime (0.5mg ). 01/10/24   Ines Onetha NOVAK, MD  diclofenac  Sodium (VOLTAREN ) 1 % GEL Apply 2 g topically 4 (four) times daily. 12/25/23   Rizwan, Saima, MD  divalproex  (DEPAKOTE  SPRINKLE) 125 MG capsule Take 1 capsule (125 mg total) by mouth  2 (two) times daily. 12/07/23   Ines Onetha NOVAK, MD  methocarbamol  (ROBAXIN ) 500 MG tablet Take 1 tablet (500 mg total) by mouth every 8 (eight) hours as needed for muscle spasms. 12/25/23   Earley Saucer, MD  Propylene Glycol (SYSTANE COMPLETE OP) Apply 1 drop to eye every 6 (six) hours as needed (Dry eyes).    [provider]  risperiDONE  (RISPERDAL ) 0.25 MG tablet 0.25mg  once daily at 330pm 01/10/24   Ines Onetha NOVAK, MD  sertraline  (ZOLOFT ) 100 MG tablet Take 1 tablet (100 mg total) by mouth daily. 12/02/23   Ines Onetha NOVAK, MD    Allergies: Elemental sulfur, Lortab [hydrocodone-acetaminophen ], Sulfa antibiotics, Atorvastatin, Macrobid [nitrofurantoin], Percocet [oxycodone -acetaminophen ], Reclast [zoledronic acid], Statins, and Zithromax [azithromycin]    Review of Systems  Unable to perform ROS: Dementia  Constitutional:  Negative for fever.  Respiratory:  Negative for shortness of breath.   Cardiovascular:  Negative for chest pain.  Gastrointestinal:  Negative for abdominal pain.  Neurological:  Negative for headaches.    Updated Vital Signs BP (!) 105/40 (BP Location: Left Arm)   Pulse 68   Temp 98.3 F (36.8 C) (Oral)   Resp 12   Ht 5' 6 (1.676 m)   Wt 73 kg   SpO2 90%   BMI 25.98 kg/m   Physical Exam Vitals and nursing note reviewed.  Constitutional:      General: She is not in acute distress.    Appearance: Normal appearance. She is well-developed.  HENT:     Head: Normocephalic and atraumatic.     Nose: Nose normal.   Eyes:     Pupils: Pupils are equal, round, and reactive to light.    Cardiovascular:     Rate and Rhythm: Normal rate and regular rhythm.     Pulses: Normal pulses.     Heart sounds: Normal heart sounds.  Pulmonary:     Effort: Pulmonary effort is normal. No respiratory distress.     Breath sounds: Normal breath sounds.  Abdominal:     General: Bowel sounds are normal. There is no distension.     Palpations: Abdomen is soft.      Tenderness: There is no abdominal tenderness. There is no guarding or rebound.   Musculoskeletal:        General: Normal range of motion.     Cervical back: Normal range of motion and neck supple.   Skin:    General: Skin is warm and dry.     Capillary Refill: Capillary refill takes less than 2 seconds.     Findings: No erythema or rash.   Neurological:     Deep Tendon Reflexes: Reflexes normal.   Psychiatric:        Mood and Affect: Mood normal.     (all labs ordered are listed, but only abnormal results are displayed) Labs Reviewed - No data to display  EKG: None  Radiology: CT Head Wo Contrast Result Date: 04/10/2024 CLINICAL DATA:  Polytrauma, blunt EXAM: CT HEAD WITHOUT CONTRAST CT CERVICAL SPINE WITHOUT CONTRAST TECHNIQUE: Multidetector CT imaging of the head and cervical spine was performed following the standard protocol without intravenous contrast. Multiplanar CT image reconstructions of the cervical spine were also generated. RADIATION DOSE REDUCTION: This exam was performed according to the departmental dose-optimization program which includes automated exposure control, adjustment of the mA and/or kV according to patient size and/or use of iterative reconstruction technique. COMPARISON:  April 05, 2024 CT head and CT cervical spine. FINDINGS: CT HEAD FINDINGS Brain: No evidence of acute infarction, hemorrhage, hydrocephalus, extra-axial collection or mass lesion/mass effect. Vascular: No hyperdense vessel. Skull: No acute fracture. Sinuses/Orbits: Right maxillary, frontal sinus and left sphenoid sinus mucosal thickening. Right maxillary sinus retention cyst. No acute orbital findings. Other: No mastoid effusions. CT CERVICAL SPINE FINDINGS Alignment: Similar alignment with mild anterolisthesis of C4 on C5. Skull base and vertebrae: Vertebral body heights are maintained. No evidence of acute fracture. Soft tissues and spinal canal: No prevertebral fluid or swelling. No visible  canal hematoma. Disc levels:  Similar moderate multilevel degenerative change. Upper chest: Visualized lung apices are clear. IMPRESSION: No acute intracranial or cervical spine finding. Electronically Signed   By: Gilmore GORMAN Molt M.D.   On: 04/10/2024 00:34   CT Cervical Spine Wo Contrast Result Date: 04/10/2024 CLINICAL DATA:  Polytrauma, blunt EXAM: CT HEAD WITHOUT CONTRAST CT CERVICAL SPINE WITHOUT CONTRAST TECHNIQUE: Multidetector CT imaging of the head and cervical spine was performed following the standard protocol without intravenous contrast. Multiplanar CT image reconstructions of the cervical spine were also generated. RADIATION DOSE REDUCTION: This exam was performed according to the departmental dose-optimization program which includes automated exposure control, adjustment of the mA and/or kV according to patient size and/or use of iterative reconstruction technique. COMPARISON:  April 05, 2024 CT head and CT cervical spine. FINDINGS: CT HEAD FINDINGS Brain: No evidence of acute infarction, hemorrhage, hydrocephalus, extra-axial collection or mass lesion/mass effect. Vascular: No hyperdense vessel. Skull: No acute fracture. Sinuses/Orbits: Right maxillary, frontal  sinus and left sphenoid sinus mucosal thickening. Right maxillary sinus retention cyst. No acute orbital findings. Other: No mastoid effusions. CT CERVICAL SPINE FINDINGS Alignment: Similar alignment with mild anterolisthesis of C4 on C5. Skull base and vertebrae: Vertebral body heights are maintained. No evidence of acute fracture. Soft tissues and spinal canal: No prevertebral fluid or swelling. No visible canal hematoma. Disc levels:  Similar moderate multilevel degenerative change. Upper chest: Visualized lung apices are clear. IMPRESSION: No acute intracranial or cervical spine finding. Electronically Signed   By: Gilmore GORMAN Molt M.D.   On: 04/10/2024 00:34   DG Shoulder Left Result Date: 04/10/2024 CLINICAL DATA:  Fall out of  bed.  Left shoulder pain EXAM: LEFT SHOULDER - 2+ VIEW COMPARISON:  04/05/2024 FINDINGS: No acute fracture or dislocation. Axillary surgical clips. Chronic sclerotic focus in the left humeral head is unchanged. IMPRESSION: No acute fracture or dislocation. Electronically Signed   By: Norman Gatlin M.D.   On: 04/10/2024 00:23     Procedures   Medications Ordered in the ED - No data to display                                  Medical Decision Making Patient with fall at nursing   Amount and/or Complexity of Data Reviewed Radiology: ordered and independent interpretation performed.    Details: Head CT normal by me   Risk Risk Details: Patient with fall at nursing home.  No injuries.  Moving all 4 extremities without pain.  Stable for discharge. Strict returns.       Final diagnoses:  Fall, initial encounter   No signs of systemic illness or infection. The patient is nontoxic-appearing on exam and vital signs are within normal limits.  I have reviewed the triage vital signs and the nursing notes. Pertinent labs & imaging results that were available during my care of the patient were reviewed by me and considered in my medical decision making (see chart for details). After history, exam, and medical workup I feel the patient has been appropriately medically screened and is safe for discharge home. Pertinent diagnoses were discussed with the patient. Patient was given return precautions.     ED Discharge Orders     None          Betania Dizon, MD 04/10/24 9874

## 2024-04-28 ENCOUNTER — Emergency Department (HOSPITAL_BASED_OUTPATIENT_CLINIC_OR_DEPARTMENT_OTHER)
Admission: EM | Admit: 2024-04-28 | Discharge: 2024-04-28 | Disposition: A | Discharge status: Skilled Nursing Facility | Attending: Emergency Medicine | Admitting: Emergency Medicine

## 2024-04-28 ENCOUNTER — Encounter (HOSPITAL_BASED_OUTPATIENT_CLINIC_OR_DEPARTMENT_OTHER): Payer: Self-pay

## 2024-04-28 ENCOUNTER — Other Ambulatory Visit: Payer: Self-pay

## 2024-04-28 DIAGNOSIS — G309 Alzheimer's disease, unspecified: Secondary | ICD-10-CM | POA: Insufficient documentation

## 2024-04-28 DIAGNOSIS — I2510 Atherosclerotic heart disease of native coronary artery without angina pectoris: Secondary | ICD-10-CM | POA: Insufficient documentation

## 2024-04-28 DIAGNOSIS — D649 Anemia, unspecified: Secondary | ICD-10-CM | POA: Diagnosis not present

## 2024-04-28 DIAGNOSIS — F322 Major depressive disorder, single episode, severe without psychotic features: Secondary | ICD-10-CM | POA: Diagnosis present

## 2024-04-28 DIAGNOSIS — Z853 Personal history of malignant neoplasm of breast: Secondary | ICD-10-CM | POA: Diagnosis not present

## 2024-04-28 DIAGNOSIS — F02C11 Dementia in other diseases classified elsewhere, severe, with agitation: Secondary | ICD-10-CM | POA: Insufficient documentation

## 2024-04-28 LAB — CBC WITH DIFFERENTIAL/PLATELET
Abs Immature Granulocytes: 0.11 K/uL — ABNORMAL HIGH (ref 0.00–0.07)
Basophils Absolute: 0 K/uL (ref 0.0–0.1)
Basophils Relative: 0 %
Eosinophils Absolute: 0.2 K/uL (ref 0.0–0.5)
Eosinophils Relative: 6 %
HCT: 23.5 % — ABNORMAL LOW (ref 36.0–46.0)
Hemoglobin: 7.8 g/dL — ABNORMAL LOW (ref 12.0–15.0)
Immature Granulocytes: 4 %
Lymphocytes Relative: 23 %
Lymphs Abs: 0.6 K/uL — ABNORMAL LOW (ref 0.7–4.0)
MCH: 28.2 pg (ref 26.0–34.0)
MCHC: 33.2 g/dL (ref 30.0–36.0)
MCV: 84.8 fL (ref 80.0–100.0)
Monocytes Absolute: 0.3 K/uL (ref 0.1–1.0)
Monocytes Relative: 9 %
Neutro Abs: 1.6 K/uL — ABNORMAL LOW (ref 1.7–7.7)
Neutrophils Relative %: 58 %
Platelets: 149 K/uL — ABNORMAL LOW (ref 150–400)
RBC: 2.77 MIL/uL — ABNORMAL LOW (ref 3.87–5.11)
RDW: 16.2 % — ABNORMAL HIGH (ref 11.5–15.5)
WBC: 2.7 K/uL — ABNORMAL LOW (ref 4.0–10.5)
nRBC: 0 % (ref 0.0–0.2)

## 2024-04-28 LAB — URINALYSIS, W/ REFLEX TO CULTURE (INFECTION SUSPECTED)
Bilirubin Urine: NEGATIVE
Glucose, UA: NEGATIVE mg/dL
Hgb urine dipstick: NEGATIVE
Ketones, ur: NEGATIVE mg/dL
Leukocytes,Ua: NEGATIVE
Nitrite: NEGATIVE
Protein, ur: NEGATIVE mg/dL
Specific Gravity, Urine: 1.01 (ref 1.005–1.030)
pH: 6 (ref 5.0–8.0)

## 2024-04-28 LAB — BASIC METABOLIC PANEL WITH GFR
Anion gap: 11 (ref 5–15)
BUN: 21 mg/dL (ref 8–23)
CO2: 25 mmol/L (ref 22–32)
Calcium: 8.6 mg/dL — ABNORMAL LOW (ref 8.9–10.3)
Chloride: 105 mmol/L (ref 98–111)
Creatinine, Ser: 0.73 mg/dL (ref 0.44–1.00)
GFR, Estimated: >60 mL/min (ref >60)
Glucose, Bld: 93 mg/dL (ref 70–99)
Potassium: 4.8 mmol/L (ref 3.5–5.1)
Sodium: 141 mmol/L (ref 135–145)

## 2024-04-28 LAB — HEMOGLOBIN AND HEMATOCRIT, BLOOD
HCT: 23.2 % — ABNORMAL LOW (ref 36.0–46.0)
Hemoglobin: 7.7 g/dL — ABNORMAL LOW (ref 12.0–15.0)

## 2024-04-28 MED ORDER — ALPRAZOLAM 0.5 MG PO TABS
0.5000 mg | ORAL_TABLET | Freq: Once | ORAL | Status: AC
  Administered 2024-04-28: 0.5 mg via ORAL
  Filled 2024-04-28: qty 1

## 2024-04-28 MED ORDER — RISPERIDONE 0.5 MG PO TABS
0.5000 mg | ORAL_TABLET | Freq: Once | ORAL | Status: AC
  Administered 2024-04-28: 0.5 mg via ORAL
  Filled 2024-04-28: qty 1

## 2024-04-28 MED ORDER — CLONAZEPAM 0.5 MG PO TABS: 0.5000 mg | ORAL_TABLET | Freq: Once | ORAL | Status: DC

## 2024-04-28 MED ORDER — FERROUS SULFATE 325 (65 FE) MG PO TABS
325.0000 mg | ORAL_TABLET | Freq: Two times a day (BID) | ORAL | 0 refills | Status: AC
Start: 2024-04-28 — End: ?

## 2024-04-28 NOTE — ED Provider Notes (Signed)
 [place] EMERGENCY DEPARTMENT AT [facility] Provider Note   CSN: [id number] Arrival date & time: 04/28/24  1437     Patient presents with: Medical Clearance   Beverly Jacobs is a 89 y.o. female.   Pt is a 89y/o female with hx of Alzheimer's dementia, history of breast cancer, coronary artery disease, macular degeneration, peripheral neuropathy who currently lives in a memory care unit and is being brought in today due to behavioral issues.  Her son-in-law's present who is also her POA and is very well versed in her care.  He reports that she does not sleep well at night and is up most of the time and at times can be agitated.  Today she was rolling up and down the hall in her walker stating she wanted to die and the facility became concerned and called 911.  She saw psychiatrist 3 weeks ago and at that time had her Risperdal  increased, her rexalti was discontinued and she was placed on Klonopin  3 times a day with as needed Xanax .  He reports that as far as he knows she has otherwise been in her normal state of health.  Today he reported he did not want her to come to the emergency room but the facility insisted that she be transported.  He has no specific concerns at this time.  When speaking with the patient she does report that she is sad and it is related to her husband who she was estranged from and died years ago which she does not remember.  When asked if she wanted to hurt herself, commit suicide or die she reports no to all of these questions.  She has no specific complaints and just reports that she is sad  The history is provided by the nursing home, a relative and the patient.       Prior to Admission medications   Medication Sig Start Date End Date Taking? Authorizing Provider  ALPRAZolam  (XANAX ) 0.25 MG tablet Take 1 tablet (0.25 mg total) by mouth 3 (three) times daily as needed for anxiety. This is to be used as needed in between clonazepam  doses but only for  severe agitation that is not redirectable and also for patient having significant hallucinations, delusions, anxiety especially if the patient is significantly upset or is there is danger to the patient or others. 12/25/23  Yes Rizwan, Saima, MD  clonazepam  (KLONOPIN ) 0.125 MG disintegrating tablet Take one tablet twice daily (.125mg ) and 2 tablets at bedtime (0.5mg ). Patient taking differently: 0.25 mg 3 (three) times daily. Take one tablet 0.25 MG 3 times daily 01/10/24  Yes Ines Onetha NOVAK, MD  diclofenac  Sodium (VOLTAREN ) 1 % GEL Apply 2 g topically 4 (four) times daily. 12/25/23  Yes Rizwan, Saima, MD  divalproex  (DEPAKOTE  SPRINKLE) 125 MG capsule Take 1 capsule (125 mg total) by mouth 2 (two) times daily. 12/07/23  Yes Ines Onetha NOVAK, MD  ferrous sulfate  325 (65 FE) MG tablet Take 1 tablet (325 mg total) by mouth 2 (two) times daily with a meal. 04/28/24  Yes Doretha Folks, MD  methocarbamol  (ROBAXIN ) 500 MG tablet Take 1 tablet (500 mg total) by mouth every 8 (eight) hours as needed for muscle spasms. 12/25/23  Yes Rizwan, Saima, MD  omeprazole (PRILOSEC) 20 MG capsule Take 20 mg by mouth daily.   Yes [provider]  Propylene Glycol (SYSTANE COMPLETE OP) Apply 1 drop to eye every 6 (six) hours as needed (Dry eyes).   Yes [provider]  risperiDONE  (RISPERDAL ) 0.25 MG tablet 0.25mg  once daily at 330pm Patient taking differently: Take 0.5 mg by mouth 2 (two) times daily. Take 1 tablet twice daily 0.5 mg at 8 am and 4 pm 01/10/24  Yes Ines Onetha NOVAK, MD  senna-docusate (SENOKOT-S) 8.6-50 MG tablet Take 1 tablet by mouth daily. Hold for loose stools   Yes [provider]  sertraline  (ZOLOFT ) 100 MG tablet Take 1 tablet (100 mg total) by mouth daily. 12/02/23  Yes Ines Onetha NOVAK, MD  acetaminophen  (TYLENOL ) 500 MG tablet Take 1,000 mg by mouth See admin instructions. Take 1000mg  by mouth twice a day, also allowed to take every six hours as needed    [provider]  cephALEXin  (KEFLEX ) 500 MG capsule Take 1 capsule (500 mg total) by mouth 4 (four) times daily. 03/16/24   Franklyn Sid SAILOR, MD    Allergies: Elemental sulfur, Lortab [hydrocodone-acetaminophen ], Sulfa antibiotics, Atorvastatin, Macrobid [nitrofurantoin], Percocet [oxycodone -acetaminophen ], Reclast [zoledronic acid], Statins, and Zithromax [azithromycin]    Review of Systems  Updated Vital Signs BP (!) 125/54   Pulse 62   Temp (!) 97.4 F (36.3 C)   Resp 18   Ht 5' 6 (1.676 m)   Wt 73 kg   SpO2 99%   BMI 25.98 kg/m   Physical Exam Vitals and nursing note reviewed.  Constitutional:      General: She is not in acute distress.    Appearance: She is well-developed.  HENT:     Head: Normocephalic and atraumatic.  Eyes:     Pupils: Pupils are equal, round, and reactive to light.  Cardiovascular:     Rate and Rhythm: Normal rate and regular rhythm.     Heart sounds: Normal heart sounds. No murmur heard.    No friction rub.  Pulmonary:     Effort: Pulmonary effort is normal.     Breath sounds: Normal breath sounds. No wheezing or rales.  Abdominal:     General: Bowel sounds are normal. There is no distension.     Palpations: Abdomen is soft.     Tenderness: There is no abdominal tenderness. There is no guarding or rebound.  Musculoskeletal:        General: No tenderness. Normal range of motion.     Comments: No edema  Skin:    General: Skin is warm and dry.     Findings: No rash.  Neurological:     Mental Status: She is alert and oriented to person, place, and time.     Cranial Nerves: No cranial nerve deficit.  Psychiatric:        Behavior: Behavior is cooperative.        Thought Content: Thought content does not include suicidal ideation.     Comments: Sad demeanor     (all labs ordered are listed, but only abnormal results are displayed) Labs Reviewed  CBC WITH DIFFERENTIAL/PLATELET - Abnormal; Notable for the following components:      Result Value    WBC 2.7 (*)    RBC 2.77 (*)    Hemoglobin 7.8 (*)    HCT 23.5 (*)    RDW 16.2 (*)    Platelets 149 (*)    Neutro Abs 1.6 (*)    Lymphs Abs 0.6 (*)    Abs Immature Granulocytes 0.11 (*)    All other components within normal limits  BASIC METABOLIC PANEL WITH GFR - Abnormal; Notable for the following components:   Calcium 8.6 (*)    All other  components within normal limits  URINALYSIS, W/ REFLEX TO CULTURE (INFECTION SUSPECTED) - Abnormal; Notable for the following components:   APPearance CLOUDY (*)    Bacteria, UA MANY (*)    All other components within normal limits  HEMOGLOBIN AND HEMATOCRIT, BLOOD - Abnormal; Notable for the following components:   Hemoglobin 7.7 (*)    HCT 23.2 (*)    All other components within normal limits    EKG: None  Radiology: No results found.   Procedures   Medications Ordered in the ED - No data to display                                  Medical Decision Making Amount and/or Complexity of Data Reviewed Independent Historian: guardian and EMS External Data Reviewed: notes. Labs: ordered. Decision-making details documented in ED Course.  Risk OTC drugs.   Pt with multiple medical problems and comorbidities and presenting today with a complaint that caries a high risk for morbidity and mortality. Here today due to reporting that she was going to die or kill herself today at the nursing home.  Patient has advanced dementia.  At this time she does not remember saying that and states she does not want to die when asked.  Her family member does not think she would ever hurt herself and she has never tried in the past.  She is under the care of a psychiatrist and they have recently been adjusting medications to see if they can get her to sleep better at night.  She is displaying no significant medical findings today.  Vital signs are stable, patient is afebrile. I independently interpreted patient's labs and BMP and UA are both without  acute findings today.  CBC does show a worsening anemia now with a hemoglobin of 7.8 from 9.3 about a month ago.  Had a long discussion with patient's POA discussed what patient's wishes would be.  He reports that patient would not want a blood transfusion and she would not want a extensive workup for the anemia.  Discussed with him I do not know what is causing the anemia but if she continues to become more more anemic discussed what that would look like and how she would feel.  He reports still she would not want any additional aggressive measures.  Will start on oral iron and she will follow-up with her PCP.  At this time do not feel that that has anything to do with why she came today.  She has remained content and is now talking about going to school.  She has not said anything about suicidal thoughts and has denied it here.  At this time she does not appear to need a psychiatric evaluation.  Feel that she can be discharged back to her facility and continue to be seen by PCP.  She was given a prescription for ferrous sulfate .     Final diagnoses:  Agitated depression (HCC)  Severe Alzheimer's dementia with agitation, unspecified timing of dementia onset (HCC)  Anemia, unspecified type    ED Discharge Orders          Ordered    ferrous sulfate  325 (65 FE) MG tablet  2 times daily with meals        04/28/24 1633               Doretha Folks, MD 04/28/24 1638

## 2024-04-28 NOTE — ED Triage Notes (Addendum)
 According to POA, son in law.  Pt has been staying in an assisted living memory care unit.  He states that the patient has been increasingly confused in the facility and has been sent to ER several times for UTI which has not really been clearly diagnosed.  He also relates that the psychiatrist has come to the facility to see the patient and has begun some medications to help with confusion and behavioral issues at the facility.  Today the nurse at the AL insisted that the patient come to the ED for evaluation as she had stated she wanted to die.  He states she cannot really hurt herself at the facility and that he did not think the patient required any behavioral intervention as she will forget what she said within the hour.  He felt the facility staff were needing a respite.  Pt actively denies wanting to kill herself,  denies wanting to die.

## 2024-04-28 NOTE — ED Notes (Addendum)
 SABRA

## 2024-04-28 NOTE — ED Notes (Signed)
 Patient medicated with nightly home medication

## 2024-04-28 NOTE — ED Notes (Signed)
 [facility] at bedside for transport back to SNF.  Family at bedside and will update nursing staff.  MAR sent with patient and DNR returned.

## 2024-04-28 NOTE — ED Notes (Signed)
 Pt will be transported back to facility via [facility]. Transport notified

## 2024-04-28 NOTE — Discharge Instructions (Addendum)
 No sign of urinary tract infection today and kidneys and electrolytes are normal.  You do have a worsening anemia and you were given iron for this but you can follow-up with your regular doctor for some more testing if you desire that.  Continue the current medications the psychiatrist recommended and you can reach out to them for possible dose adjustment if needed.  Continue to use the Xanax  as needed for increased behavior.

## 2024-04-28 NOTE — ED Notes (Signed)
 Called [facility] for transport back to [facility] @16 :40.

## 2024-04-28 NOTE — ED Triage Notes (Addendum)
 Pt with history of dementia, presents from [facility] nursing facility via [facility]. Per EMS, the facility wanted pt to a medication rec as the psych team at facility refused to see the pt.   Facility also states pt did not sleep last night.  Per POA at bedside pt has been making statements about wanting to die.

## 2024-05-27 ENCOUNTER — Emergency Department (HOSPITAL_COMMUNITY)

## 2024-05-27 ENCOUNTER — Other Ambulatory Visit: Payer: Self-pay

## 2024-05-27 ENCOUNTER — Emergency Department (HOSPITAL_COMMUNITY)
Admission: EM | Admit: 2024-05-27 | Discharge: 2024-05-27 | Disposition: A | Discharge status: Home / Self Care | Attending: Emergency Medicine | Admitting: Emergency Medicine

## 2024-05-27 ENCOUNTER — Encounter (HOSPITAL_COMMUNITY): Payer: Self-pay | Admitting: *Deleted

## 2024-05-27 DIAGNOSIS — Z79899 Other long term (current) drug therapy: Secondary | ICD-10-CM | POA: Diagnosis not present

## 2024-05-27 DIAGNOSIS — W01198A Fall on same level from slipping, tripping and stumbling with subsequent striking against other object, initial encounter: Secondary | ICD-10-CM | POA: Insufficient documentation

## 2024-05-27 DIAGNOSIS — F0390 Unspecified dementia without behavioral disturbance: Secondary | ICD-10-CM | POA: Diagnosis not present

## 2024-05-27 DIAGNOSIS — S4991XA Unspecified injury of right shoulder and upper arm, initial encounter: Secondary | ICD-10-CM | POA: Diagnosis present

## 2024-05-27 DIAGNOSIS — W19XXXA Unspecified fall, initial encounter: Secondary | ICD-10-CM

## 2024-05-27 DIAGNOSIS — S40011A Contusion of right shoulder, initial encounter: Secondary | ICD-10-CM | POA: Diagnosis not present

## 2024-05-27 LAB — BASIC METABOLIC PANEL WITH GFR
Anion gap: 9 (ref 5–15)
BUN: 20 mg/dL (ref 8–23)
CO2: 23 mmol/L (ref 22–32)
Calcium: 8.8 mg/dL — ABNORMAL LOW (ref 8.9–10.3)
Chloride: 102 mmol/L (ref 98–111)
Creatinine, Ser: 0.75 mg/dL (ref 0.44–1.00)
GFR, Estimated: >60 mL/min (ref >60)
Glucose, Bld: 99 mg/dL (ref 70–99)
Potassium: 4.3 mmol/L (ref 3.5–5.1)
Sodium: 134 mmol/L — ABNORMAL LOW (ref 135–145)

## 2024-05-27 LAB — CBC WITH DIFFERENTIAL/PLATELET
Abs Immature Granulocytes: 0.03 K/uL (ref 0.00–0.07)
Basophils Absolute: 0 K/uL (ref 0.0–0.1)
Basophils Relative: 1 %
Eosinophils Absolute: 0.1 K/uL (ref 0.0–0.5)
Eosinophils Relative: 3 %
HCT: 26.7 % — ABNORMAL LOW (ref 36.0–46.0)
Hemoglobin: 9.2 g/dL — ABNORMAL LOW (ref 12.0–15.0)
Immature Granulocytes: 1 %
Lymphocytes Relative: 19 %
Lymphs Abs: 0.7 K/uL (ref 0.7–4.0)
MCH: 29 pg (ref 26.0–34.0)
MCHC: 34.5 g/dL (ref 30.0–36.0)
MCV: 84.2 fL (ref 80.0–100.0)
Monocytes Absolute: 0.3 K/uL (ref 0.1–1.0)
Monocytes Relative: 8 %
Neutro Abs: 2.7 K/uL (ref 1.7–7.7)
Neutrophils Relative %: 68 %
Platelets: 165 K/uL (ref 150–400)
RBC: 3.17 MIL/uL — ABNORMAL LOW (ref 3.87–5.11)
RDW: 14.3 % (ref 11.5–15.5)
WBC: 3.9 K/uL — ABNORMAL LOW (ref 4.0–10.5)
nRBC: 0 % (ref 0.0–0.2)

## 2024-05-27 NOTE — ED Notes (Signed)
 Ptar called

## 2024-05-27 NOTE — ED Triage Notes (Signed)
 Pt from [facility] for unwitness fall ~2 hours ago. Found on left side; c-collar placed for precautions. Reported by EMS generalized pain noted with movement. Pt denies complaints at present; however noted to grimace with rom to right shoulder. EMS VS 134/70, pulse 70, cbg 119

## 2024-05-27 NOTE — Discharge Instructions (Addendum)
 Fortunately the CT scans and x-rays did not show any bleeding or fracture.  Follow-up with your primary care provider.  If you develop new or worsening symptoms then return to the ER.

## 2024-05-27 NOTE — ED Provider Notes (Signed)
 [place] EMERGENCY DEPARTMENT AT [facility] Provider Note   CSN: [id number] Arrival date & time: 05/27/24  2055     Patient presents with: Beverly Jacobs is a 89 y.o. female.   HPI 89 year old female presents with a fall. History is mostly from daughter. She was being transferred from her wheelchair to the bed and fell. Staff thinks she hit her head. She injured her right shoulder a couple days ago and currently is having pain there. No other obvious injuries.  Patient has advanced dementia and is unable to provide much history. She was recently diagnosed with anemia and put on iron. No new change in mental status per daughter.  Prior to Admission medications   Medication Sig Start Date End Date Taking? Authorizing Provider  acetaminophen  (TYLENOL ) 500 MG tablet Take 1,000 mg by mouth See admin instructions. Take 1000mg  by mouth twice a day, also allowed to take every six hours as needed    [provider]  ALPRAZolam  (XANAX ) 0.25 MG tablet Take 1 tablet (0.25 mg total) by mouth 3 (three) times daily as needed for anxiety. This is to be used as needed in between clonazepam  doses but only for severe agitation that is not redirectable and also for patient having significant hallucinations, delusions, anxiety especially if the patient is significantly upset or is there is danger to the patient or others. 12/25/23   Rizwan, Saima, MD  cephALEXin  (KEFLEX ) 500 MG capsule Take 1 capsule (500 mg total) by mouth 4 (four) times daily. 03/16/24   Franklyn Sid SAILOR, MD  clonazepam  (KLONOPIN ) 0.125 MG disintegrating tablet Take one tablet twice daily (.125mg ) and 2 tablets at bedtime (0.5mg ). Patient taking differently: 0.25 mg 3 (three) times daily. Take one tablet 0.25 MG 3 times daily 01/10/24   Ines Onetha NOVAK, MD  diclofenac  Sodium (VOLTAREN ) 1 % GEL Apply 2 g topically 4 (four) times daily. 12/25/23   Rizwan, Saima, MD  divalproex  (DEPAKOTE  SPRINKLE) 125 MG capsule Take 1  capsule (125 mg total) by mouth 2 (two) times daily. 12/07/23   Ines Onetha NOVAK, MD  ferrous sulfate  325 (65 FE) MG tablet Take 1 tablet (325 mg total) by mouth 2 (two) times daily with a meal. 04/28/24   Doretha Folks, MD  methocarbamol  (ROBAXIN ) 500 MG tablet Take 1 tablet (500 mg total) by mouth every 8 (eight) hours as needed for muscle spasms. 12/25/23   Rizwan, Saima, MD  omeprazole (PRILOSEC) 20 MG capsule Take 20 mg by mouth daily.    [provider]  Propylene Glycol (SYSTANE COMPLETE OP) Apply 1 drop to eye every 6 (six) hours as needed (Dry eyes).    [provider]  risperiDONE  (RISPERDAL ) 0.25 MG tablet 0.25mg  once daily at 330pm Patient taking differently: Take 0.5 mg by mouth 2 (two) times daily. Take 1 tablet twice daily 0.5 mg at 8 am and 4 pm 01/10/24   Ines Onetha NOVAK, MD  senna-docusate (SENOKOT-S) 8.6-50 MG tablet Take 1 tablet by mouth daily. Hold for loose stools    [provider]  sertraline  (ZOLOFT ) 100 MG tablet Take 1 tablet (100 mg total) by mouth daily. 12/02/23   Ines Onetha NOVAK, MD    Allergies: Elemental sulfur, Lortab [hydrocodone-acetaminophen ], Sulfa antibiotics, Atorvastatin, Macrobid [nitrofurantoin], Percocet [oxycodone -acetaminophen ], Reclast [zoledronic acid], Statins, and Zithromax [azithromycin]    Review of Systems  Unable to perform ROS: Dementia    Updated Vital Signs BP (!) 132/46   Pulse (!) 53   Temp  98 F (36.7 C)   Resp 16   SpO2 100%   Physical Exam Vitals and nursing note reviewed.  Constitutional:      Appearance: She is well-developed.     Interventions: Cervical collar in place.  HENT:     Head: Normocephalic and atraumatic.  Cardiovascular:     Rate and Rhythm: Normal rate and regular rhythm.     Heart sounds: Normal heart sounds.  Pulmonary:     Effort: Pulmonary effort is normal.     Breath sounds: Normal breath sounds.  Abdominal:     Palpations: Abdomen is soft.     Tenderness: There is  no abdominal tenderness.  Musculoskeletal:     Right shoulder: Tenderness present. No deformity. Decreased range of motion.     Right upper arm: No tenderness.     Right elbow: Normal range of motion. No tenderness.     Right hip: No tenderness. Normal range of motion.     Left hip: No tenderness.     Right knee: Normal range of motion. No tenderness.     Left knee: Normal range of motion. No tenderness.  Skin:    General: Skin is warm and dry.  Neurological:     Mental Status: She is alert.     (all labs ordered are listed, but only abnormal results are displayed) Labs Reviewed  BASIC METABOLIC PANEL WITH GFR - Abnormal; Notable for the following components:      Result Value   Sodium 134 (*)    Calcium 8.8 (*)    All other components within normal limits  CBC WITH DIFFERENTIAL/PLATELET - Abnormal; Notable for the following components:   WBC 3.9 (*)    RBC 3.17 (*)    Hemoglobin 9.2 (*)    HCT 26.7 (*)    All other components within normal limits    EKG: None  Radiology: CT Head Wo Contrast Result Date: 05/27/2024 EXAM: CT HEAD AND CERVICAL SPINE 05/27/2024 09:53:26 PM TECHNIQUE: CT of the head and cervical spine was performed without the administration of intravenous contrast. Multiplanar reformatted images are provided for review. Automated exposure control, iterative reconstruction, and/or weight based adjustment of the mA/kV was utilized to reduce the radiation dose to as low as reasonably achievable. COMPARISON: 04/10/2024 CLINICAL HISTORY: Head trauma, minor (Age >= 65y). Unwitnessed fall, pt states 3 falls. FINDINGS: CT HEAD BRAIN AND VENTRICLES: No acute intracranial hemorrhage. No mass effect or midline shift. No abnormal extra-axial fluid collection. Gray-white differentiation is maintained. No hydrocephalus. Mild volume loss and chronic ischemic white matter changes. ORBITS: No acute abnormality. SINUSES AND MASTOIDS: Large right maxillary sinus retention cyst. SOFT  TISSUES AND SKULL: No acute skull fracture. No acute soft tissue abnormality. CT CERVICAL SPINE BONES AND ALIGNMENT: No acute fracture or traumatic malalignment. Grade 1 anterolisthesis at C4-5. DEGENERATIVE CHANGES: No significant degenerative changes. SOFT TISSUES: No prevertebral soft tissue swelling. IMPRESSION: 1. No acute intracranial abnormality. 2. No acute fracture or traumatic malalignment of the cervical spine. Electronically signed by: Franky Stanford MD 05/27/2024 10:48 PM EDT RP Workstation: [id number]   CT Cervical Spine Wo Contrast Result Date: 05/27/2024 EXAM: CT HEAD AND CERVICAL SPINE 05/27/2024 09:53:26 PM TECHNIQUE: CT of the head and cervical spine was performed without the administration of intravenous contrast. Multiplanar reformatted images are provided for review. Automated exposure control, iterative reconstruction, and/or weight based adjustment of the mA/kV was utilized to reduce the radiation dose to as low as reasonably achievable. COMPARISON: 04/10/2024 CLINICAL HISTORY: Head  trauma, minor (Age >= 65y). Unwitnessed fall, pt states 3 falls. FINDINGS: CT HEAD BRAIN AND VENTRICLES: No acute intracranial hemorrhage. No mass effect or midline shift. No abnormal extra-axial fluid collection. Gray-white differentiation is maintained. No hydrocephalus. Mild volume loss and chronic ischemic white matter changes. ORBITS: No acute abnormality. SINUSES AND MASTOIDS: Large right maxillary sinus retention cyst. SOFT TISSUES AND SKULL: No acute skull fracture. No acute soft tissue abnormality. CT CERVICAL SPINE BONES AND ALIGNMENT: No acute fracture or traumatic malalignment. Grade 1 anterolisthesis at C4-5. DEGENERATIVE CHANGES: No significant degenerative changes. SOFT TISSUES: No prevertebral soft tissue swelling. IMPRESSION: 1. No acute intracranial abnormality. 2. No acute fracture or traumatic malalignment of the cervical spine. Electronically signed by: Franky Stanford MD 05/27/2024 10:48 PM EDT  RP Workstation: [id number]   DG Shoulder Right Result Date: 05/27/2024 CLINICAL DATA:  Fall, pain EXAM: RIGHT SHOULDER - 2+ VIEW COMPARISON:  None Available. FINDINGS: Degenerative changes in the [facility] joint with joint space narrowing and spurring. Glenohumeral joint is maintained. No acute bony abnormality. Specifically, no fracture, subluxation, or dislocation. Soft tissues are intact. IMPRESSION: Degenerative changes in the right AC joint. No acute bony abnormality. Electronically Signed   By: Franky Crease M.D.   On: 05/27/2024 22:32     Procedures   Medications Ordered in the ED - No data to display                                  Medical Decision Making Amount and/or Complexity of Data Reviewed Labs: ordered.    Details: Improving hemoglobin Radiology: ordered and independent interpretation performed.    Details: No shoulder fracture or head bleed   Presents with recurrent fall.  Discussed with daughter at the bedside.  For the most part, daughter does not want much done but would like to know if there is any fractures or head bleed so imaging was pursued.  Given recent anemia diagnosis and her being on iron she would like hemoglobin checked.  These are all unremarkable compared to before.  No acute injuries.  At this point I think is reasonable to discharge back to her facility and daughter is considering talking to hospice as well.  Will discharge with return precautions.     Final diagnoses:  Fall, initial encounter  Contusion of right shoulder, initial encounter    ED Discharge Orders     None          Freddi Hamilton, MD 05/27/24 2309

## 2024-05-27 NOTE — ED Notes (Signed)
 Pt taken to CT.

## 2024-05-27 NOTE — ED Notes (Signed)
 Patient verbalizes understanding of discharge instructions. Opportunity for questioning and answers were provided. Armband removed by staff, pt discharged from ED. Taken via [facility] to return to [place] facility

## 2024-06-12 ENCOUNTER — Emergency Department (HOSPITAL_COMMUNITY)

## 2024-06-12 ENCOUNTER — Encounter (HOSPITAL_COMMUNITY): Payer: Self-pay

## 2024-06-12 ENCOUNTER — Other Ambulatory Visit: Payer: Self-pay

## 2024-06-12 ENCOUNTER — Emergency Department (HOSPITAL_COMMUNITY)
Admission: EM | Admit: 2024-06-12 | Discharge: 2024-06-12 | Disposition: A | Discharge status: Home / Self Care | Attending: Emergency Medicine | Admitting: Emergency Medicine

## 2024-06-12 DIAGNOSIS — F0390 Unspecified dementia without behavioral disturbance: Secondary | ICD-10-CM | POA: Insufficient documentation

## 2024-06-12 DIAGNOSIS — M25512 Pain in left shoulder: Secondary | ICD-10-CM | POA: Insufficient documentation

## 2024-06-12 DIAGNOSIS — S0990XA Unspecified injury of head, initial encounter: Secondary | ICD-10-CM | POA: Diagnosis present

## 2024-06-12 DIAGNOSIS — S0083XA Contusion of other part of head, initial encounter: Secondary | ICD-10-CM | POA: Insufficient documentation

## 2024-06-12 DIAGNOSIS — S0012XA Contusion of left eyelid and periocular area, initial encounter: Secondary | ICD-10-CM | POA: Insufficient documentation

## 2024-06-12 DIAGNOSIS — W06XXXA Fall from bed, initial encounter: Secondary | ICD-10-CM | POA: Insufficient documentation

## 2024-06-12 DIAGNOSIS — Z8659 Personal history of other mental and behavioral disorders: Secondary | ICD-10-CM

## 2024-06-12 DIAGNOSIS — Y92129 Unspecified place in nursing home as the place of occurrence of the external cause: Secondary | ICD-10-CM | POA: Diagnosis not present

## 2024-06-12 MED ORDER — ACETAMINOPHEN 325 MG PO TABS
650.0000 mg | ORAL_TABLET | Freq: Once | ORAL | Status: AC
  Administered 2024-06-12: 650 mg via ORAL
  Filled 2024-06-12: qty 2

## 2024-06-12 NOTE — ED Notes (Signed)
 This paramedic called to give report to [facility] again with no answer

## 2024-06-12 NOTE — ED Notes (Signed)
 Patient transported to CT

## 2024-06-12 NOTE — ED Triage Notes (Signed)
 Pt from [facility] for unwitnessed fall out of bed, pt was found between the wall and bed. Pt c.o left shoulder pain. C collar in place. Per staff pt seems more altered from her baseline dementia due to mentioned family members she has never mentioned before. Pt also has bruising to her left eye. No thinners. VSS

## 2024-06-12 NOTE — ED Provider Notes (Signed)
  [place] EMERGENCY DEPARTMENT AT [facility] Provider Note   CSN: [id number] Arrival date & time: 06/12/24  9270     Patient presents with: Beverly Jacobs is a 89 y.o. female.   Pt with advanced dementia arrives from SNF via EMS after fall from bed. Hx falls. No loc noted. Pts mental status currently appears c/w baseline. Pt very limited historian - level 5 caveat. Ems reports contusion to head/face and c/o left shoulder pain. No anticoag use.   The history is provided by the patient, medical records and the EMS personnel. The history is limited by the condition of the patient.  Fall       Prior to Admission medications   Medication Sig Start Date End Date Taking? Authorizing Provider  acetaminophen  (TYLENOL ) 500 MG tablet Take 1,000 mg by mouth See admin instructions. Take 1000mg  by mouth twice a day, also allowed to take every six hours as needed    [provider]  ALPRAZolam  (XANAX ) 0.25 MG tablet Take 1 tablet (0.25 mg total) by mouth 3 (three) times daily as needed for anxiety. This is to be used as needed in between clonazepam  doses but only for severe agitation that is not redirectable and also for patient having significant hallucinations, delusions, anxiety especially if the patient is significantly upset or is there is danger to the patient or others. 12/25/23   Rizwan, Saima, MD  cephALEXin  (KEFLEX ) 500 MG capsule Take 1 capsule (500 mg total) by mouth 4 (four) times daily. 03/16/24   Franklyn Sid SAILOR, MD  clonazepam  (KLONOPIN ) 0.125 MG disintegrating tablet Take one tablet twice daily (.125mg ) and 2 tablets at bedtime (0.5mg ). Patient taking differently: 0.25 mg 3 (three) times daily. Take one tablet 0.25 MG 3 times daily 01/10/24   Ines Onetha NOVAK, MD  diclofenac  Sodium (VOLTAREN ) 1 % GEL Apply 2 g topically 4 (four) times daily. 12/25/23   Rizwan, Saima, MD  divalproex  (DEPAKOTE  SPRINKLE) 125 MG capsule Take 1 capsule (125 mg total) by mouth 2  (two) times daily. 12/07/23   Ines Onetha NOVAK, MD  ferrous sulfate  325 (65 FE) MG tablet Take 1 tablet (325 mg total) by mouth 2 (two) times daily with a meal. 04/28/24   Doretha Folks, MD  methocarbamol  (ROBAXIN ) 500 MG tablet Take 1 tablet (500 mg total) by mouth every 8 (eight) hours as needed for muscle spasms. 12/25/23   Rizwan, Saima, MD  omeprazole (PRILOSEC) 20 MG capsule Take 20 mg by mouth daily.    [provider]  Propylene Glycol (SYSTANE COMPLETE OP) Apply 1 drop to eye every 6 (six) hours as needed (Dry eyes).    [provider]  risperiDONE  (RISPERDAL ) 0.25 MG tablet 0.25mg  once daily at 330pm Patient taking differently: Take 0.5 mg by mouth 2 (two) times daily. Take 1 tablet twice daily 0.5 mg at 8 am and 4 pm 01/10/24   Ines Onetha NOVAK, MD  senna-docusate (SENOKOT-S) 8.6-50 MG tablet Take 1 tablet by mouth daily. Hold for loose stools    [provider]  sertraline  (ZOLOFT ) 100 MG tablet Take 1 tablet (100 mg total) by mouth daily. 12/02/23   Ines Onetha NOVAK, MD    Allergies: Elemental sulfur, Lortab [hydrocodone-acetaminophen ], Sulfa antibiotics, Atorvastatin, Macrobid [nitrofurantoin], Percocet [oxycodone -acetaminophen ], Reclast [zoledronic acid], Statins, and Zithromax [azithromycin]    Review of Systems  Unable to perform ROS: Dementia    Updated Vital Signs BP 109/83 (BP Location: Right Arm)   Pulse 77  Temp 98.1 F (36.7 C) (Axillary)   Resp (!) 21   SpO2 100%   Physical Exam Vitals and nursing note reviewed.  Constitutional:      Appearance: Normal appearance. She is well-developed.  HENT:     Head:     Comments: Contusion head/left eyebrow area.     Nose: Nose normal.     Mouth/Throat:     Mouth: Mucous membranes are moist.  Eyes:     General: No scleral icterus.    Conjunctiva/sclera: Conjunctivae normal.     Pupils: Pupils are equal, round, and reactive to light.  Neck:     Trachea: No tracheal deviation.   Cardiovascular:     Rate and Rhythm: Normal rate and regular rhythm.     Pulses: Normal pulses.     Heart sounds: Normal heart sounds. No murmur heard.    No friction rub. No gallop.  Pulmonary:     Effort: Pulmonary effort is normal. No respiratory distress.     Breath sounds: Normal breath sounds.  Chest:     Chest wall: No tenderness.  Abdominal:     General: Bowel sounds are normal. There is no distension.     Palpations: Abdomen is soft.     Tenderness: There is no abdominal tenderness. There is no guarding.     Comments: No abd bruising or contusion  Musculoskeletal:        General: No swelling.     Cervical back: Normal range of motion and neck supple. No rigidity. No muscular tenderness.     Comments: CTLS spine, non tender, aligned, no step off. ?tenderness left shoulder and bil hips - challenging exam, pt without specific c/o but seems to c/o pain when these areas touched/moved. No gross deformity noted. Distal pulses palp bil.   Skin:    General: Skin is warm and dry.     Findings: No rash.  Neurological:     Mental Status: She is alert.     Comments: Alert, speech not grossly dysarthric or aphasic. Moves bil extremities purposefully with good strength.   Psychiatric:        Mood and Affect: Mood normal.     (all labs ordered are listed, but only abnormal results are displayed) Labs Reviewed - No data to display  EKG: None  Radiology: DG HIP PORT UNILAT WITH PELVIS 1V LEFT Result Date: 06/12/2024 CLINICAL DATA:  89 year old female with pain after fall. EXAM: DG HIP (WITH OR WITHOUT PELVIS) 1V PORT LEFT COMPARISON:  Right hip series today. CT Abdomen and Pelvis 12/22/2023. FINDINGS: Two views at 0929 hours. AP view of the pelvis is oblique to the right, opposite the scout view in March. Osteopenia. No pelvis fracture identified. Femoral heads normally located. Hip joint spaces appear normal for age. Proximal left femur appears intact. Nonobstructed bowel-gas pattern  with rectosigmoid colon retained stool. IMPRESSION: Osteopenia. No acute fracture or dislocation identified about the left hip or pelvis. Electronically Signed   By: VEAR Hurst M.D.   On: 06/12/2024 09:56   DG HIP UNILAT WITH PELVIS 2-3 VIEWS RIGHT Result Date: 06/12/2024 CLINICAL DATA:  89 year old female with pain after fall. EXAM: DG HIP (WITH OR WITHOUT PELVIS) 2-3V RIGHT COMPARISON:  Left hip series today reported separately. CT Abdomen and Pelvis 12/22/2023. FINDINGS: Two views at 0928 hours. Right femoral head normally located. Proximal right femur appears intact. Grossly intact visible pelvis.  Retained rectosigmoid stool. IMPRESSION: No acute fracture or dislocation identified about the proximal right femur.  Electronically Signed   By: VEAR Hurst M.D.   On: 06/12/2024 09:53   DG Shoulder Left Result Date: 06/12/2024 EXAM: 1 VIEW XRAY OF THE LEFT SHOULDER 06/12/2024 09:44:00 AM COMPARISON: 04/10/2024 CLINICAL HISTORY: Fall, ? pain. Reason for exam: fall, pain; Triage notes: unwitnessed fall out of bed, pt was found between the wall and bed. Pt c.o left shoulder pain. Pt also has bruising to her left eye. FINDINGS: BONES AND JOINTS: Glenohumeral joint is normally aligned. No acute fracture or dislocation. The [facility] joint is unremarkable in appearance. Stable chronic sclerotic focus in left humeral head. SOFT TISSUES: No abnormal calcifications. Visualized lung is unremarkable. Surgical clips in left axilla, stable compared to prior. Atherosclerotic calcifications. IMPRESSION: 1. No acute fracture or dislocation. 2. Stable chronic sclerotic focus in left humeral head. Electronically signed by: Waddell Calk MD 06/12/2024 09:50 AM EDT RP Workstation: [id number]   CT Head Wo Contrast Result Date: 06/12/2024 CLINICAL DATA:  Head trauma, minor (Age >= 65y); Neck trauma (Age >= 65y) EXAM: CT HEAD WITHOUT CONTRAST CT CERVICAL SPINE WITHOUT CONTRAST TECHNIQUE: Multidetector CT imaging of the head and cervical spine  was performed following the standard protocol without intravenous contrast. Multiplanar CT image reconstructions of the cervical spine were also generated. RADIATION DOSE REDUCTION: This exam was performed according to the departmental dose-optimization program which includes automated exposure control, adjustment of the mA and/or kV according to patient size and/or use of iterative reconstruction technique. COMPARISON:  CT head 05/27/2024.  CT cervical spine 04/10/2024. FINDINGS: CT HEAD FINDINGS Brain: No evidence of acute infarction, hemorrhage, hydrocephalus, extra-axial collection or mass lesion/mass effect. Vascular: No hyperdense vessel. Skull: No acute fracture. Sinuses/Orbits: Right maxillary retention cyst. No acute orbital findings. CT CERVICAL SPINE FINDINGS Alignment: No substantial sagittal subluxation. Skull base and vertebrae: No acute fracture. Soft tissues and spinal canal: No prevertebral fluid or swelling. No visible canal hematoma. Disc levels: Multilevel degenerative change including disc plate spurring and uncovertebral and facet hypertrophy. Upper chest: Lung apices are clear. IMPRESSION: No acute intracranial or cervical spine finding. Electronically Signed   By: Gilmore GORMAN Molt M.D.   On: 06/12/2024 09:23   CT Cervical Spine Wo Contrast Result Date: 06/12/2024 CLINICAL DATA:  Head trauma, minor (Age >= 65y); Neck trauma (Age >= 65y) EXAM: CT HEAD WITHOUT CONTRAST CT CERVICAL SPINE WITHOUT CONTRAST TECHNIQUE: Multidetector CT imaging of the head and cervical spine was performed following the standard protocol without intravenous contrast. Multiplanar CT image reconstructions of the cervical spine were also generated. RADIATION DOSE REDUCTION: This exam was performed according to the departmental dose-optimization program which includes automated exposure control, adjustment of the mA and/or kV according to patient size and/or use of iterative reconstruction technique. COMPARISON:  CT  head 05/27/2024.  CT cervical spine 04/10/2024. FINDINGS: CT HEAD FINDINGS Brain: No evidence of acute infarction, hemorrhage, hydrocephalus, extra-axial collection or mass lesion/mass effect. Vascular: No hyperdense vessel. Skull: No acute fracture. Sinuses/Orbits: Right maxillary retention cyst. No acute orbital findings. CT CERVICAL SPINE FINDINGS Alignment: No substantial sagittal subluxation. Skull base and vertebrae: No acute fracture. Soft tissues and spinal canal: No prevertebral fluid or swelling. No visible canal hematoma. Disc levels: Multilevel degenerative change including disc plate spurring and uncovertebral and facet hypertrophy. Upper chest: Lung apices are clear. IMPRESSION: No acute intracranial or cervical spine finding. Electronically Signed   By: Gilmore GORMAN Molt M.D.   On: 06/12/2024 09:23     Procedures   Medications Ordered in the ED  acetaminophen  (TYLENOL ) tablet 650 mg (  has no administration in time range)                                    Medical Decision Making Problems Addressed: Contusion of face, initial encounter: acute illness or injury with systemic symptoms that poses a threat to life or bodily functions Fall from bed, initial encounter: acute illness or injury with systemic symptoms that poses a threat to life or bodily functions History of dementia: chronic illness or injury that poses a threat to life or bodily functions  Amount and/or Complexity of Data Reviewed Independent Historian: EMS    Details: hx External Data Reviewed: notes. Radiology: ordered and independent interpretation performed. Decision-making details documented in ED Course.  Risk OTC drugs. Decision regarding hospitalization.   Iv ns. Continuous pulse ox and cardiac monitoring. Imaging ordered.   Differential diagnosis includes  . Dispo decision including potential need for admission considered - will get imaging and reassess.   Reviewed nursing notes and prior charts for  additional history. External reports reviewed. Additional history from: EMS.   Cardiac monitor: sinus rhythm, rate 67.  Xrays reviewed/interpreted by me - no fx.   CT reviewed/interpreted by me - no hem.  Pt appears comfortable, in no acute pain or distress, and appears stable for return to facility.   Rec close pcp f/u.  Return precautions provided.       Final diagnoses:  Fall from bed, initial encounter  History of dementia  Contusion of face, initial encounter    ED Discharge Orders     None          Bernard Drivers, MD 06/12/24 1046

## 2024-06-12 NOTE — ED Notes (Signed)
 This paramedic tried to call report to [facility] and had to leave a message to call back

## 2024-06-12 NOTE — ED Notes (Signed)
 Pt DC with [facility] to SNF

## 2024-06-12 NOTE — ED Notes (Addendum)
 Pt more disoriented at this time. Pt has been re-directed multiple times and told that she is in the hospital. Pt continues to call out for [facility] and then asks this paramedic not to leave her. Pt becoming very agitated with the pulse ox monitoring cord.

## 2024-06-12 NOTE — Discharge Instructions (Signed)
 It was our pleasure to provide your ER care today - we hope that you feel better.  Fall precautions.   Return to ER if worse, new symptoms, fevers, new/severe pain, trouble breathing, or other emergency concern.

## 2024-06-12 NOTE — ED Notes (Signed)
 [facility] has been scheduled for the patient to return to [facility].  NO ETA could be provided by dispatch.  RN made aware of transport status.

## 2024-08-13 DEATH — deceased
